# Patient Record
Sex: Male | Born: 1955 | Race: White | Hispanic: No | Marital: Married | Smoking: Current some day smoker
Health system: Southern US, Community
[De-identification: ages and names within clinical notes are randomized; demographics above are authoritative.]

## PROBLEM LIST (undated history)

## (undated) DIAGNOSIS — D649 Anemia, unspecified: Secondary | ICD-10-CM

## (undated) DIAGNOSIS — K219 Gastro-esophageal reflux disease without esophagitis: Secondary | ICD-10-CM

## (undated) DIAGNOSIS — E785 Hyperlipidemia, unspecified: Secondary | ICD-10-CM

## (undated) DIAGNOSIS — M1990 Unspecified osteoarthritis, unspecified site: Secondary | ICD-10-CM

## (undated) DIAGNOSIS — Z8619 Personal history of other infectious and parasitic diseases: Secondary | ICD-10-CM

## (undated) HISTORY — PX: SINOSCOPY: SHX187

## (undated) HISTORY — PX: SPINAL FUSION: SHX223

## (undated) HISTORY — PX: KNEE ARTHROSCOPY: SUR90

## (undated) HISTORY — PX: ANTERIOR CRUCIATE LIGAMENT REPAIR: SHX115

---

## 1999-01-26 ENCOUNTER — Other Ambulatory Visit: Admission: RE | Admit: 1999-01-26 | Discharge: 1999-01-26 | Payer: Self-pay | Admitting: Urology

## 1999-04-08 ENCOUNTER — Other Ambulatory Visit: Admission: RE | Admit: 1999-04-08 | Discharge: 1999-04-08 | Payer: Self-pay | Admitting: Urology

## 1999-12-16 ENCOUNTER — Other Ambulatory Visit: Admission: RE | Admit: 1999-12-16 | Discharge: 1999-12-16 | Payer: Self-pay | Admitting: Urology

## 2000-10-03 ENCOUNTER — Other Ambulatory Visit: Admission: RE | Admit: 2000-10-03 | Discharge: 2000-10-03 | Payer: Self-pay | Admitting: Urology

## 2014-08-21 ENCOUNTER — Other Ambulatory Visit (HOSPITAL_COMMUNITY): Payer: Self-pay | Admitting: Neurosurgery

## 2014-08-29 NOTE — Pre-Procedure Instructions (Signed)
ARGIE APPLEGATE  08/29/2014   Your procedure is scheduled on: Monday, September 08, 2014  Report to [facility] Admitting at 5:30 AM.  Call this number if you have problems the morning of surgery: [phone]   Remember:   Do not eat food or drink liquids after midnight Sunday, September 07, 2014   Take these medicines the morning of surgery with A SIP OF WATER: none  Stop taking Aspirin, vitamins, and herbal medications. Do not take any NSAIDs ie: Ibuprofen, Advil, Naproxen or any medication containing Aspirin such as Meloxicam (MOBIC) ; stop wed/09/03/14   Do not wear jewelry, make-up or nail polish.  Do not wear lotions, powders, or perfumes. You may not wear deodorant.  Do not shave 48 hours prior to surgery. Men may shave face and neck.  Do not bring valuables to the hospital.  [facility] is not responsible for any belongings or valuables.               Contacts, dentures or bridgework may not be worn into surgery.  Leave suitcase in the car. After surgery it may be brought to your room.  For patients admitted to the hospital, discharge time is determined by your treatment team.               Patients discharged the day of surgery will not be allowed to drive home.  Name and phone number of your driver:   Special Instructions:  Special Instructions:Special Instructions: [place] - Preparing for Surgery  Before surgery, you can play an important role.  Because skin is not sterile, your skin needs to be as free of germs as possible.  You can reduce the number of germs on you skin by washing with CHG (chlorahexidine gluconate) soap before surgery.  CHG is an antiseptic cleaner which kills germs and bonds with the skin to continue killing germs even after washing.  Please DO NOT use if you have an allergy to CHG or antibacterial soaps.  If your skin becomes reddened/irritated stop using the CHG and inform your nurse when you arrive at Short Stay.  Do not shave (including  legs and underarms) for at least 48 hours prior to the first CHG shower.  You may shave your face.  Please follow these instructions carefully:   1.  Shower with CHG Soap the night before surgery and the morning of Surgery.  2.  If you choose to wash your hair, wash your hair first as usual with your normal shampoo.  3.  After you shampoo, rinse your hair and body thoroughly to remove the Shampoo.  4.  Use CHG as you would any other liquid soap.  You can apply chg directly  to the skin and wash gently with scrungie or a clean washcloth.  5.  Apply the CHG Soap to your body ONLY FROM THE NECK DOWN.  Do not use on open wounds or open sores.  Avoid contact with your eyes, ears, mouth and genitals (private parts).  Wash genitals (private parts) with your normal soap.  6.  Wash thoroughly, paying special attention to the area where your surgery will be performed.  7.  Thoroughly rinse your body with warm water from the neck down.  8.  DO NOT shower/wash with your normal soap after using and rinsing off the CHG Soap.  9.  Pat yourself dry with a clean towel.            10 .  Wear clean  pajamas.            11.  Place clean sheets on your bed the night of your first shower and do not sleep with pets.  Day of Surgery  Do not apply any lotions/deodorants the morning of surgery.  Please wear clean clothes to the hospital/surgery center.   Please read over the following fact sheets that you were given: Pain Booklet, Coughing and Deep Breathing, MRSA Information and Surgical Site Infection Prevention

## 2014-09-01 ENCOUNTER — Encounter (HOSPITAL_COMMUNITY): Payer: Self-pay

## 2014-09-01 ENCOUNTER — Encounter (HOSPITAL_COMMUNITY)
Admission: RE | Admit: 2014-09-01 | Discharge: 2014-09-01 | Disposition: A | Discharge status: Home / Self Care | Payer: 59 | Source: Ambulatory Visit | Attending: Neurosurgery | Admitting: Neurosurgery

## 2014-09-01 DIAGNOSIS — Z01812 Encounter for preprocedural laboratory examination: Secondary | ICD-10-CM | POA: Diagnosis present

## 2014-09-01 DIAGNOSIS — G9519 Other vascular myelopathies: Secondary | ICD-10-CM | POA: Insufficient documentation

## 2014-09-01 HISTORY — DX: Gastro-esophageal reflux disease without esophagitis: K21.9

## 2014-09-01 HISTORY — DX: Unspecified osteoarthritis, unspecified site: M19.90

## 2014-09-01 LAB — CBC
HCT: 41.3 % (ref 39.0–52.0)
Hemoglobin: 14.4 g/dL (ref 13.0–17.0)
MCH: 31.7 pg (ref 26.0–34.0)
MCHC: 34.9 g/dL (ref 30.0–36.0)
MCV: 91 fL (ref 78.0–100.0)
Platelets: 194 10*3/uL (ref 150–400)
RBC: 4.54 MIL/uL (ref 4.22–5.81)
RDW: 13.3 % (ref 11.5–15.5)
WBC: 6.8 10*3/uL (ref 4.0–10.5)

## 2014-09-01 LAB — BASIC METABOLIC PANEL
Anion gap: 3 — ABNORMAL LOW (ref 5–15)
BUN: 17 mg/dL (ref 6–23)
CO2: 29 mmol/L (ref 19–32)
Calcium: 9.2 mg/dL (ref 8.4–10.5)
Chloride: 105 mEq/L (ref 96–112)
Creatinine, Ser: 1.15 mg/dL (ref 0.50–1.35)
GFR calc Af Amer: 79 mL/min — ABNORMAL LOW (ref >90)
GFR calc non Af Amer: 68 mL/min — ABNORMAL LOW (ref >90)
Glucose, Bld: 105 mg/dL — ABNORMAL HIGH (ref 70–99)
Potassium: 4 mmol/L (ref 3.5–5.1)
Sodium: 137 mmol/L (ref 135–145)

## 2014-09-01 LAB — SURGICAL PCR SCREEN
MRSA, PCR: NEGATIVE
Staphylococcus aureus: NEGATIVE

## 2014-09-07 MED ORDER — CEFAZOLIN SODIUM-DEXTROSE 2-3 GM-% IV SOLR
2.0000 g | INTRAVENOUS | Status: AC
  Administered 2014-09-08: 2 g via INTRAVENOUS
  Filled 2014-09-07: qty 50

## 2014-09-08 ENCOUNTER — Ambulatory Visit (HOSPITAL_COMMUNITY): Payer: 59 | Admitting: Anesthesiology

## 2014-09-08 ENCOUNTER — Ambulatory Visit (HOSPITAL_COMMUNITY): Payer: 59

## 2014-09-08 ENCOUNTER — Ambulatory Visit (HOSPITAL_COMMUNITY)
Admission: RE | Admit: 2014-09-08 | Discharge: 2014-09-09 | Disposition: A | Discharge status: Home / Self Care | Payer: 59 | Source: Ambulatory Visit | Attending: Neurosurgery | Admitting: Neurosurgery

## 2014-09-08 ENCOUNTER — Encounter (HOSPITAL_COMMUNITY)
Admission: RE | Disposition: A | Discharge status: Home / Self Care | Payer: Self-pay | Source: Ambulatory Visit | Attending: Neurosurgery

## 2014-09-08 ENCOUNTER — Encounter (HOSPITAL_COMMUNITY): Payer: Self-pay | Admitting: *Deleted

## 2014-09-08 DIAGNOSIS — M47896 Other spondylosis, lumbar region: Secondary | ICD-10-CM | POA: Insufficient documentation

## 2014-09-08 DIAGNOSIS — F17210 Nicotine dependence, cigarettes, uncomplicated: Secondary | ICD-10-CM | POA: Diagnosis not present

## 2014-09-08 DIAGNOSIS — M4806 Spinal stenosis, lumbar region: Secondary | ICD-10-CM | POA: Diagnosis present

## 2014-09-08 DIAGNOSIS — M48061 Spinal stenosis, lumbar region without neurogenic claudication: Secondary | ICD-10-CM

## 2014-09-08 DIAGNOSIS — M1990 Unspecified osteoarthritis, unspecified site: Secondary | ICD-10-CM | POA: Insufficient documentation

## 2014-09-08 DIAGNOSIS — M5136 Other intervertebral disc degeneration, lumbar region: Secondary | ICD-10-CM | POA: Insufficient documentation

## 2014-09-08 DIAGNOSIS — K219 Gastro-esophageal reflux disease without esophagitis: Secondary | ICD-10-CM | POA: Insufficient documentation

## 2014-09-08 DIAGNOSIS — M48062 Spinal stenosis, lumbar region with neurogenic claudication: Secondary | ICD-10-CM | POA: Diagnosis present

## 2014-09-08 HISTORY — PX: LUMBAR LAMINECTOMY/DECOMPRESSION MICRODISCECTOMY: SHX5026

## 2014-09-08 SURGERY — LUMBAR LAMINECTOMY/DECOMPRESSION MICRODISCECTOMY 1 LEVEL
Anesthesia: General | Laterality: Bilateral

## 2014-09-08 MED ORDER — GLYCOPYRROLATE 0.2 MG/ML IJ SOLN
INTRAMUSCULAR | Status: AC
  Filled 2014-09-08: qty 2

## 2014-09-08 MED ORDER — BUPIVACAINE HCL (PF) 0.5 % IJ SOLN
INTRAMUSCULAR | Status: DC | PRN
  Administered 2014-09-08: 10 mL

## 2014-09-08 MED ORDER — KETOROLAC TROMETHAMINE 0.5 % OP SOLN
1.0000 [drp] | Freq: Four times a day (QID) | OPHTHALMIC | Status: DC
  Administered 2014-09-08 (×2): 1 [drp] via OPHTHALMIC
  Filled 2014-09-08: qty 3

## 2014-09-08 MED ORDER — HYDROMORPHONE HCL 1 MG/ML IJ SOLN
INTRAMUSCULAR | Status: AC
  Administered 2014-09-08: 0.5 mg via INTRAVENOUS
  Filled 2014-09-08: qty 1

## 2014-09-08 MED ORDER — FENTANYL CITRATE 0.05 MG/ML IJ SOLN
INTRAMUSCULAR | Status: AC
  Filled 2014-09-08: qty 5

## 2014-09-08 MED ORDER — ACETAMINOPHEN 325 MG PO TABS
650.0000 mg | ORAL_TABLET | ORAL | Status: DC | PRN
  Administered 2014-09-08: 650 mg via ORAL
  Filled 2014-09-08: qty 2

## 2014-09-08 MED ORDER — KETOROLAC TROMETHAMINE 30 MG/ML IJ SOLN
30.0000 mg | Freq: Four times a day (QID) | INTRAMUSCULAR | Status: DC
  Administered 2014-09-08 – 2014-09-09 (×4): 30 mg via INTRAVENOUS
  Filled 2014-09-08 (×12): qty 1

## 2014-09-08 MED ORDER — KCL IN DEXTROSE-NACL 20-5-0.45 MEQ/L-%-% IV SOLN
INTRAVENOUS | Status: DC
  Filled 2014-09-08 (×4): qty 1000

## 2014-09-08 MED ORDER — KETOROLAC TROMETHAMINE 30 MG/ML IJ SOLN
30.0000 mg | Freq: Once | INTRAMUSCULAR | Status: AC
  Administered 2014-09-08: 30 mg via INTRAVENOUS

## 2014-09-08 MED ORDER — EPHEDRINE SULFATE 50 MG/ML IJ SOLN
INTRAMUSCULAR | Status: DC | PRN
  Administered 2014-09-08 (×2): 2.5 mg via INTRAVENOUS
  Administered 2014-09-08: 5 mg via INTRAVENOUS

## 2014-09-08 MED ORDER — MENTHOL 3 MG MT LOZG
1.0000 | LOZENGE | OROMUCOSAL | Status: DC | PRN
  Filled 2014-09-08: qty 9

## 2014-09-08 MED ORDER — SODIUM CHLORIDE 0.9 % IJ SOLN: 3.0000 mL | INTRAMUSCULAR | Status: DC | PRN

## 2014-09-08 MED ORDER — SODIUM CHLORIDE 0.9 % IJ SOLN
INTRAMUSCULAR | Status: AC
  Filled 2014-09-08: qty 10

## 2014-09-08 MED ORDER — CYCLOBENZAPRINE HCL 10 MG PO TABS
10.0000 mg | ORAL_TABLET | Freq: Three times a day (TID) | ORAL | Status: DC | PRN
  Administered 2014-09-08 – 2014-09-09 (×4): 10 mg via ORAL
  Filled 2014-09-08 (×4): qty 1

## 2014-09-08 MED ORDER — HEMOSTATIC AGENTS (NO CHARGE) OPTIME
TOPICAL | Status: DC | PRN
  Administered 2014-09-08: 1 via TOPICAL

## 2014-09-08 MED ORDER — MIDAZOLAM HCL 5 MG/5ML IJ SOLN
INTRAMUSCULAR | Status: DC | PRN
  Administered 2014-09-08: 1 mg via INTRAVENOUS

## 2014-09-08 MED ORDER — OXYCODONE HCL 5 MG/5ML PO SOLN: 5.0000 mg | Freq: Once | ORAL | Status: AC | PRN

## 2014-09-08 MED ORDER — ROCURONIUM BROMIDE 50 MG/5ML IV SOLN
INTRAVENOUS | Status: AC
  Filled 2014-09-08: qty 1

## 2014-09-08 MED ORDER — SODIUM CHLORIDE 0.9 % IJ SOLN: 3.0000 mL | Freq: Two times a day (BID) | INTRAMUSCULAR | Status: DC

## 2014-09-08 MED ORDER — CYCLOBENZAPRINE HCL 10 MG PO TABS
ORAL_TABLET | ORAL | Status: AC
  Filled 2014-09-08: qty 1

## 2014-09-08 MED ORDER — ONDANSETRON HCL 4 MG/2ML IJ SOLN
INTRAMUSCULAR | Status: DC | PRN
  Administered 2014-09-08: 4 mg via INTRAVENOUS

## 2014-09-08 MED ORDER — ONDANSETRON HCL 4 MG/2ML IJ SOLN: 4.0000 mg | Freq: Four times a day (QID) | INTRAMUSCULAR | Status: DC | PRN

## 2014-09-08 MED ORDER — THROMBIN 5000 UNITS EX SOLR
CUTANEOUS | Status: DC | PRN
  Administered 2014-09-08 (×2): 5000 [IU] via TOPICAL

## 2014-09-08 MED ORDER — NEOSTIGMINE METHYLSULFATE 10 MG/10ML IV SOLN
INTRAVENOUS | Status: DC | PRN
  Administered 2014-09-08: 3 mg via INTRAVENOUS

## 2014-09-08 MED ORDER — HYDROCODONE-ACETAMINOPHEN 5-325 MG PO TABS
1.0000 | ORAL_TABLET | ORAL | Status: DC | PRN
  Administered 2014-09-08 (×3): 1 via ORAL
  Filled 2014-09-08 (×3): qty 1

## 2014-09-08 MED ORDER — LIDOCAINE HCL (CARDIAC) 20 MG/ML IV SOLN
INTRAVENOUS | Status: AC
  Filled 2014-09-08: qty 5

## 2014-09-08 MED ORDER — ONDANSETRON HCL 4 MG/2ML IJ SOLN
INTRAMUSCULAR | Status: AC
  Filled 2014-09-08: qty 2

## 2014-09-08 MED ORDER — FENTANYL CITRATE 0.05 MG/ML IJ SOLN
INTRAMUSCULAR | Status: DC | PRN
Start: 2014-09-08 — End: 2014-09-08
  Administered 2014-09-08: 50 ug via INTRAVENOUS
  Administered 2014-09-08 (×4): 25 ug via INTRAVENOUS
  Administered 2014-09-08: 100 ug via INTRAVENOUS

## 2014-09-08 MED ORDER — 0.9 % SODIUM CHLORIDE (POUR BTL) OPTIME
TOPICAL | Status: DC | PRN
  Administered 2014-09-08: 1000 mL

## 2014-09-08 MED ORDER — SUCCINYLCHOLINE CHLORIDE 20 MG/ML IJ SOLN
INTRAMUSCULAR | Status: AC
  Filled 2014-09-08: qty 1

## 2014-09-08 MED ORDER — BISACODYL 10 MG RE SUPP: 10.0000 mg | Freq: Every day | RECTAL | Status: DC | PRN

## 2014-09-08 MED ORDER — HYDROMORPHONE HCL 1 MG/ML IJ SOLN
0.2500 mg | INTRAMUSCULAR | Status: DC | PRN
  Administered 2014-09-08 (×3): 0.5 mg via INTRAVENOUS

## 2014-09-08 MED ORDER — SODIUM CHLORIDE 0.9 % IR SOLN
Status: DC | PRN
  Administered 2014-09-08: 08:00:00

## 2014-09-08 MED ORDER — MIDAZOLAM HCL 2 MG/2ML IJ SOLN
INTRAMUSCULAR | Status: AC
  Filled 2014-09-08: qty 2

## 2014-09-08 MED ORDER — ACETAMINOPHEN 10 MG/ML IV SOLN
INTRAVENOUS | Status: AC
  Administered 2014-09-08: 1000 mg via INTRAVENOUS
  Filled 2014-09-08: qty 100

## 2014-09-08 MED ORDER — GLYCOPYRROLATE 0.2 MG/ML IJ SOLN
INTRAMUSCULAR | Status: DC | PRN
  Administered 2014-09-08: 0.4 mg via INTRAVENOUS

## 2014-09-08 MED ORDER — MORPHINE SULFATE 4 MG/ML IJ SOLN: 4.0000 mg | INTRAMUSCULAR | Status: DC | PRN

## 2014-09-08 MED ORDER — LIDOCAINE-EPINEPHRINE 1 %-1:100000 IJ SOLN
INTRAMUSCULAR | Status: DC | PRN
  Administered 2014-09-08: 10 mL

## 2014-09-08 MED ORDER — LIDOCAINE HCL 4 % MT SOLN
OROMUCOSAL | Status: DC | PRN
  Administered 2014-09-08: 4 mL via TOPICAL

## 2014-09-08 MED ORDER — ACETAMINOPHEN 650 MG RE SUPP: 650.0000 mg | RECTAL | Status: DC | PRN

## 2014-09-08 MED ORDER — ROCURONIUM BROMIDE 100 MG/10ML IV SOLN
INTRAVENOUS | Status: DC | PRN
  Administered 2014-09-08: 5 mg via INTRAVENOUS
  Administered 2014-09-08: 50 mg via INTRAVENOUS
  Administered 2014-09-08: 10 mg via INTRAVENOUS
  Administered 2014-09-08: 5 mg via INTRAVENOUS
  Administered 2014-09-08: 10 mg via INTRAVENOUS

## 2014-09-08 MED ORDER — OXYCODONE HCL 5 MG PO TABS
5.0000 mg | ORAL_TABLET | Freq: Once | ORAL | Status: AC | PRN
  Administered 2014-09-08: 5 mg via ORAL

## 2014-09-08 MED ORDER — LIDOCAINE HCL (CARDIAC) 20 MG/ML IV SOLN
INTRAVENOUS | Status: DC | PRN
  Administered 2014-09-08: 80 mg via INTRAVENOUS

## 2014-09-08 MED ORDER — ARTIFICIAL TEARS OP OINT
TOPICAL_OINTMENT | OPHTHALMIC | Status: AC
  Filled 2014-09-08: qty 3.5

## 2014-09-08 MED ORDER — GELATIN ABSORBABLE MT POWD
OROMUCOSAL | Status: DC | PRN
  Administered 2014-09-08: 10:00:00 via TOPICAL

## 2014-09-08 MED ORDER — ZOLPIDEM TARTRATE 5 MG PO TABS: 5.0000 mg | ORAL_TABLET | Freq: Every evening | ORAL | Status: DC | PRN

## 2014-09-08 MED ORDER — NEOSTIGMINE METHYLSULFATE 10 MG/10ML IV SOLN
INTRAVENOUS | Status: AC
  Filled 2014-09-08: qty 1

## 2014-09-08 MED ORDER — PROPOFOL 10 MG/ML IV BOLUS
INTRAVENOUS | Status: AC
  Filled 2014-09-08: qty 20

## 2014-09-08 MED ORDER — PHENYLEPHRINE HCL 10 MG/ML IJ SOLN
INTRAMUSCULAR | Status: DC | PRN
  Administered 2014-09-08 (×2): 80 ug via INTRAVENOUS
  Administered 2014-09-08: 240 ug via INTRAVENOUS

## 2014-09-08 MED ORDER — PROPOFOL 10 MG/ML IV BOLUS
INTRAVENOUS | Status: DC | PRN
  Administered 2014-09-08: 160 mg via INTRAVENOUS

## 2014-09-08 MED ORDER — OXYCODONE-ACETAMINOPHEN 5-325 MG PO TABS
1.0000 | ORAL_TABLET | ORAL | Status: DC | PRN
  Administered 2014-09-09 (×3): 2 via ORAL
  Filled 2014-09-08 (×3): qty 2

## 2014-09-08 MED ORDER — MAGNESIUM HYDROXIDE 400 MG/5ML PO SUSP: 30.0000 mL | Freq: Every day | ORAL | Status: DC | PRN

## 2014-09-08 MED ORDER — PHENOL 1.4 % MT LIQD: 1.0000 | OROMUCOSAL | Status: DC | PRN

## 2014-09-08 MED ORDER — KETOROLAC TROMETHAMINE 30 MG/ML IJ SOLN
INTRAMUSCULAR | Status: AC
  Filled 2014-09-08: qty 1

## 2014-09-08 MED ORDER — HYDROXYZINE HCL 25 MG PO TABS: 50.0000 mg | ORAL_TABLET | ORAL | Status: DC | PRN

## 2014-09-08 MED ORDER — OXYCODONE HCL 5 MG PO TABS
ORAL_TABLET | ORAL | Status: AC
  Administered 2014-09-08: 5 mg via ORAL
  Filled 2014-09-08: qty 1

## 2014-09-08 MED ORDER — ALUM & MAG HYDROXIDE-SIMETH 200-200-20 MG/5ML PO SUSP: 30.0000 mL | Freq: Four times a day (QID) | ORAL | Status: DC | PRN

## 2014-09-08 MED ORDER — LACTATED RINGERS IV SOLN
INTRAVENOUS | Status: DC | PRN
  Administered 2014-09-08 (×2): via INTRAVENOUS

## 2014-09-08 MED ORDER — SODIUM CHLORIDE 0.9 % IV SOLN: 250.0000 mL | INTRAVENOUS | Status: DC

## 2014-09-08 SURGICAL SUPPLY — 65 items
ADH SKN CLS APL DERMABOND .7 (GAUZE/BANDAGES/DRESSINGS) ×1
APL SKNCLS STERI-STRIP NONHPOA (GAUZE/BANDAGES/DRESSINGS)
BAG DECANTER FOR FLEXI CONT (MISCELLANEOUS) ×2 IMPLANT
BENZOIN TINCTURE PRP APPL 2/3 (GAUZE/BANDAGES/DRESSINGS) IMPLANT
BLADE CLIPPER SURG (BLADE) IMPLANT
BRUSH SCRUB EZ PLAIN DRY (MISCELLANEOUS) ×2 IMPLANT
BUR ACORN 6.0 ACORN (BURR) ×1 IMPLANT
BUR ACRON 5.0MM COATED (BURR) IMPLANT
BUR MATCHSTICK NEURO 3.0 LAGG (BURR) ×2 IMPLANT
CANISTER SUCT 3000ML (MISCELLANEOUS) ×2 IMPLANT
CONT SPEC 4OZ CLIKSEAL STRL BL (MISCELLANEOUS) IMPLANT
DERMABOND ADVANCED (GAUZE/BANDAGES/DRESSINGS) ×1
DERMABOND ADVANCED .7 DNX12 (GAUZE/BANDAGES/DRESSINGS) IMPLANT
DRAPE LAPAROTOMY 100X72X124 (DRAPES) ×2 IMPLANT
DRAPE MICROSCOPE LEICA (MISCELLANEOUS) ×2 IMPLANT
DRAPE POUCH INSTRU U-SHP 10X18 (DRAPES) ×2 IMPLANT
DRSG EMULSION OIL 3X3 NADH (GAUZE/BANDAGES/DRESSINGS) IMPLANT
ELECT REM PT RETURN 9FT ADLT (ELECTROSURGICAL) ×2
ELECTRODE REM PT RTRN 9FT ADLT (ELECTROSURGICAL) ×1 IMPLANT
GAUZE SPONGE 4X4 12PLY STRL (GAUZE/BANDAGES/DRESSINGS) ×1 IMPLANT
GAUZE SPONGE 4X4 16PLY XRAY LF (GAUZE/BANDAGES/DRESSINGS) IMPLANT
GLOVE BIOGEL PI IND STRL 8 (GLOVE) ×1 IMPLANT
GLOVE BIOGEL PI INDICATOR 8 (GLOVE) ×1
GLOVE ECLIPSE 7.5 STRL STRAW (GLOVE) ×2 IMPLANT
GLOVE EXAM NITRILE LRG STRL (GLOVE) IMPLANT
GLOVE EXAM NITRILE MD LF STRL (GLOVE) IMPLANT
GLOVE EXAM NITRILE XL STR (GLOVE) IMPLANT
GLOVE EXAM NITRILE XS STR PU (GLOVE) IMPLANT
GOWN STRL REUS W/ TWL LRG LVL3 (GOWN DISPOSABLE) ×1 IMPLANT
GOWN STRL REUS W/ TWL XL LVL3 (GOWN DISPOSABLE) IMPLANT
GOWN STRL REUS W/TWL 2XL LVL3 (GOWN DISPOSABLE) IMPLANT
GOWN STRL REUS W/TWL LRG LVL3 (GOWN DISPOSABLE) ×2
GOWN STRL REUS W/TWL XL LVL3 (GOWN DISPOSABLE)
GRAFT DURAGEN MATRIX 2WX2L ×1 IMPLANT
KIT BASIN OR (CUSTOM PROCEDURE TRAY) ×2 IMPLANT
KIT ROOM TURNOVER OR (KITS) ×2 IMPLANT
LIQUID BAND (GAUZE/BANDAGES/DRESSINGS) IMPLANT
NDL HYPO 18GX1.5 BLUNT FILL (NEEDLE) IMPLANT
NDL SPNL 18GX3.5 QUINCKE PK (NEEDLE) ×1 IMPLANT
NDL SPNL 22GX3.5 QUINCKE BK (NEEDLE) ×1 IMPLANT
NEEDLE HYPO 18GX1.5 BLUNT FILL (NEEDLE) IMPLANT
NEEDLE SPNL 18GX3.5 QUINCKE PK (NEEDLE) ×2 IMPLANT
NEEDLE SPNL 22GX3.5 QUINCKE BK (NEEDLE) ×2 IMPLANT
NS IRRIG 1000ML POUR BTL (IV SOLUTION) ×2 IMPLANT
PACK LAMINECTOMY NEURO (CUSTOM PROCEDURE TRAY) ×2 IMPLANT
PAD ARMBOARD 7.5X6 YLW CONV (MISCELLANEOUS) ×6 IMPLANT
PATTIES SURGICAL .5 X.5 (GAUZE/BANDAGES/DRESSINGS) ×1 IMPLANT
PATTIES SURGICAL .5 X1 (DISPOSABLE) ×2 IMPLANT
PATTIES SURGICAL 1X1 (DISPOSABLE) ×1 IMPLANT
RUBBERBAND STERILE (MISCELLANEOUS) ×4 IMPLANT
SPONGE LAP 4X18 X RAY DECT (DISPOSABLE) IMPLANT
SPONGE SURGIFOAM ABS GEL SZ50 (HEMOSTASIS) ×2 IMPLANT
STRIP CLOSURE SKIN 1/2X4 (GAUZE/BANDAGES/DRESSINGS) IMPLANT
SURGIFLO TRUKIT (HEMOSTASIS) ×1 IMPLANT
SUT PROLENE 6 0 BV (SUTURE) ×2 IMPLANT
SUT VIC AB 1 CT1 18XBRD ANBCTR (SUTURE) ×1 IMPLANT
SUT VIC AB 1 CT1 8-18 (SUTURE) ×2
SUT VIC AB 2-0 CP2 18 (SUTURE) ×2 IMPLANT
SUT VIC AB 3-0 SH 8-18 (SUTURE) ×1 IMPLANT
SYR 20ML ECCENTRIC (SYRINGE) ×2 IMPLANT
SYR 5ML LL (SYRINGE) IMPLANT
TAPE CLOTH SURG 4X10 WHT LF (GAUZE/BANDAGES/DRESSINGS) ×1 IMPLANT
TOWEL OR 17X24 6PK STRL BLUE (TOWEL DISPOSABLE) ×2 IMPLANT
TOWEL OR 17X26 10 PK STRL BLUE (TOWEL DISPOSABLE) ×2 IMPLANT
WATER STERILE IRR 1000ML POUR (IV SOLUTION) ×2 IMPLANT

## 2014-09-08 NOTE — Transfer of Care (Signed)
Immediate Anesthesia Transfer of Care Note  Patient: Manuel Harper  Procedure(s) Performed: Procedure(s) with comments: LUMBAR LAMINECTOMY/DECOMPRESSION MICRODISCECTOMY 1 LEVEL (Bilateral) - bilateral L45 lumbar laminotomy and foraminotomy  Patient Location: PACU  Anesthesia Type:General  Level of Consciousness: awake, alert  and oriented  Airway & Oxygen Therapy: Patient Spontanous Breathing and Patient connected to nasal cannula oxygen  Post-op Assessment: Report given to PACU RN, Post -op Vital signs reviewed and stable and Patient moving all extremities  Post vital signs: Reviewed and stable  Complications: No apparent anesthesia complications

## 2014-09-08 NOTE — Anesthesia Procedure Notes (Addendum)
Procedure Name: Intubation Date/Time: 09/08/2014 7:31 AM Performed by: Izora Gala Pre-anesthesia Checklist: Patient identified and Emergency Drugs available Patient Re-evaluated:Patient Re-evaluated prior to inductionOxygen Delivery Method: Circle system utilized Preoxygenation: Pre-oxygenation with 100% oxygen Intubation Type: IV induction Ventilation: Mask ventilation without difficulty Laryngoscope Size: Miller and 3 Grade View: Grade I Tube type: Oral Tube size: 8.0 mm Number of attempts: 1 Airway Equipment and Method: Stylet and LTA kit utilized Placement Confirmation: ETT inserted through vocal cords under direct vision,  positive ETCO2 and breath sounds checked- equal and bilateral Secured at: 22 cm Tube secured with: Tape Dental Injury: Teeth and Oropharynx as per pre-operative assessment     Tube taped on left. Side of mouth per Dr. Marcie Bal because of cold sore on right lip

## 2014-09-08 NOTE — Anesthesia Preprocedure Evaluation (Addendum)
Anesthesia Evaluation  Patient identified by MRN, date of birth, ID band Patient awake    Reviewed: Allergy & Precautions, NPO status , Patient's Chart, lab work & pertinent test results  History of Anesthesia Complications Negative for: history of anesthetic complications  Airway Mallampati: II  TM Distance: >3 FB Neck ROM: full    Dental  (+) Teeth Intact   Pulmonary Current Smoker, former smoker,  breath sounds clear to auscultation  Pulmonary exam normal       Cardiovascular negative cardio ROS  Rhythm:Regular     Neuro/Psych Sciatica bilateral legs negative psych ROS   GI/Hepatic Neg liver ROS, GERD-  ,Diet controlled   Endo/Other  negative endocrine ROS  Renal/GU negative Renal ROS     Musculoskeletal  (+) Arthritis -,   Abdominal   Peds  Hematology negative hematology ROS (+)   Anesthesia Other Findings   Reproductive/Obstetrics                           Anesthesia Physical Anesthesia Plan  ASA: II  Anesthesia Plan: General   Post-op Pain Management:    Induction: Intravenous  PONV Risk Score and Plan:   Airway Management Planned: Oral ETT  Additional Equipment:   Intra-op Plan:   Post-operative Plan: Extubation in OR  Informed Consent: I have reviewed the patients History and Physical, chart, labs and discussed the procedure including the risks, benefits and alternatives for the proposed anesthesia with the patient or authorized representative who has indicated his/her understanding and acceptance.   Dental advisory given  Plan Discussed with: CRNA, Anesthesiologist and Surgeon  Anesthesia Plan Comments:         Anesthesia Quick Evaluation

## 2014-09-08 NOTE — H&P (Signed)
Subjective: Patient is a 59 y.o. 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  Patient has had progressively worsening symptoms over the past year with pain radiating into the buttocks, posterior thighs, and calves  bilaterally. He's been treated with epidural surgery injections and physical therapy without lasting relief. He is admitted now for a bilateral L4-5 lumbar laminotomy and foraminotomies.    Past Medical History  Diagnosis Date  . GERD (gastroesophageal reflux disease)   . Arthritis     Past Surgical History  Procedure Laterality Date  . Anterior cruciate ligament repair Bilateral   . Knee arthroscopy Bilateral     over yrs    Prescriptions prior to admission  Medication Sig Dispense Refill Last Dose  . acetaminophen (TYLENOL) 500 MG tablet Take 1,000 mg by mouth 2 (two) times daily.   1 tab 09/07/2014 at Unknown time  . diphenhydramine-acetaminophen (TYLENOL PM) 25-500 MG TABS Take 1 tablet by mouth at bedtime.   09/07/2014 at Unknown time  . lovastatin (MEVACOR) 40 MG tablet Take 40 mg by mouth at bedtime.   3 09/06/2014  . meloxicam (MOBIC) 15 MG tablet Take 15 mg by mouth daily.   11 09/02/2014  . Menthol, Topical Analgesic, (BIOFREEZE EX) Apply 1 application topically daily as needed (muscle aches).   ~ 1 week  . Menthol, Topical Analgesic, 6 % GEL Apply topically.   09/07/2014 at Unknown time  . valACYclovir (VALTREX) 500 MG tablet Take 500 mg by mouth daily as needed.   1 09/05/2014   No Known Allergies  History  Substance Use Topics  . Smoking status: Current Some Day Smoker    Types: Cigars  . Smokeless tobacco: Not on file  . Alcohol Use: 6.0 oz/week    5 Glasses of wine, 5 Cans of beer per week     Comment: 5-6 per week    History reviewed. No pertinent family history.   Review of Systems Review of systems is notable for those difficulties described in his history of present  illness and past medical history, but is otherwise unremarkable.  Objective: Vital signs in last 24 hours: Temp:  [98 F (36.7 C)] 98 F (36.7 C) (01/25 0629) Pulse Rate:  [79] 79 (01/25 0629) Resp:  [20] 20 (01/25 0629) BP: (124)/(85) 124/85 mmHg (01/25 0629) SpO2:  [99 %] 99 % (01/25 0629) Weight:  [82.101 kg (181 lb)] 82.101 kg (181 lb) (01/25 0629)  EXAM: patient well-developed well-nourished white male in no acute distress. Lungs are clear to auscultation , the patient has symmetrical respiratory excursion. Heart has a regular rate and rhythm normal S1 and S2 no murmur.   Abdomen is soft nontender nondistended bowel sounds are present. Extremity examination shows no clubbing cyanosis or edema. Motor examination shows 5 over 5 strength in the lower extremities including the iliopsoas quadriceps dorsiflexor extensor hallicus  longus and plantar flexor bilaterally. Sensation is intact to pinprick in the distal lower extremities. Reflexes are symmetrical bilaterally. No pathologic reflexes are present. Patient has a normal gait and stance.   Data Review:CBC    Component Value Date/Time   WBC 6.8 09/01/2014 0834   RBC 4.54 09/01/2014 0834   HGB 14.4 09/01/2014 0834   HCT 41.3 09/01/2014 0834   PLT 194 09/01/2014 0834   MCV 91.0 09/01/2014 0834   MCH 31.7 09/01/2014 0834   MCHC 34.9 09/01/2014 0834   RDW 13.3 09/01/2014 0834  BMET    Component Value Date/Time   NA 137 09/01/2014 0834   K 4.0 09/01/2014 0834   CL 105 09/01/2014 0834   CO2 29 09/01/2014 0834   GLUCOSE 105* 09/01/2014 0834   BUN 17 09/01/2014 0834   CREATININE 1.15 09/01/2014 0834   CALCIUM 9.2 09/01/2014 0834   GFRNONAA 68* 09/01/2014 0834   GFRAA 79* 09/01/2014 0834     Assessment/Plan: Patient with multifactorial L4-5 lumbar stenosis who is admitted for bilateral L4-5 lumbar laminotomy and foraminotomy.  I've discussed with the patient the nature of his condition, the nature the  surgical procedure, the typical length of surgery, hospital stay, and overall recuperation. We discussed limitations postoperatively. I discussed risks of surgery including risks of infection, bleeding, possibly need for transfusion, the risk of nerve root dysfunction with pain, weakness, numbness, or paresthesias, or risk of dural tear and CSF leakage and possible need for further surgery, the risk of recurrent disc herniation and the possible need for further surgery, and the risk of anesthetic complications including myocardial infarction, stroke, pneumonia, and death. Understanding all this the patient does wish to proceed with surgery and is admitted for such.    Hosie Spangle, MD 09/08/2014 7:08 AM

## 2014-09-08 NOTE — Progress Notes (Signed)
Filed Vitals:   09/08/14 1150 09/08/14 1209 09/08/14 1215 09/08/14 1607  BP: 109/75 139/84  125/80  Pulse: 69 64  98  Temp:  98 F (36.7 C) 98 F (36.7 C)   TempSrc:      Resp: 16 16  16   Weight:      SpO2: 99% 100%  100%    Patient resting in bed, has been flat in bed, with head of bed at 0 since surgery. No headache. Voiding well. Moving all 4 extremity is well. Dressing clean and dry.  Plan: We'll begin to mobilize now, gradually raising head of bed, then sitting and dangling, then standing, and ambulating.  Hosie Spangle, MD 09/08/2014, 5:32 PM

## 2014-09-08 NOTE — Op Note (Signed)
09/08/2014  10:49 AM  PATIENT:  Manuel Harper  59 y.o. male  PRE-OPERATIVE DIAGNOSIS:  lumbar stenosis with neurogenic claudication, lumbar spondylosis, lumbar degenerative disc disease  POST-OPERATIVE DIAGNOSIS:  Lumbar stenosis with neurogenic claudication, lumbar spondylosis, lumbar degenerative disc disease  PROCEDURE:  Procedure(s):  Bilateral L4-5 laminectomy and medial facetectomy with bilateral foraminotomies for the exiting L4 and L5 nerve roots bilaterally, with microdissection, microsurgical technique, and the operating microscope  SURGEON:  Surgeon(s): Hosie Spangle, MD Elaina Hoops, MD  ASSISTANTS: Kary Kos, M.D.  ANESTHESIA:   general  EBL:  Total I/O In: 1000 [I.V.:1000] Out: 150 [Blood:150]  BLOOD ADMINISTERED:none  COUNT: Correct per nursing staff  DICTATION: Patient was brought to the operating room placed under general endotracheal anesthesia. Patient was turned to a prone position the lumbar region was prepped with Betadine soap and solution and draped in a sterile fashion. The midline was infiltrated with local anesthetic with epinephrine. A localizing x-ray was taken and then a midline incision was made carried down thru the subcutaneous tissue, bipolar cautery and electrocautery were used to maintain hemostasis. Dissection was carried down to the lumbar fascia which was incised bilaterally and the paraspinal muscles were dissected from the spinous process and lamina in a subperiosteal fashion. Another localizing x-ray was taken and the L4-5 level was identified. The operating microscope was brought in the field provided additional magnification, illumination, and visualization, and the remainder the decompression was performed using microdissection microsurgical technique. Laminectomy was begun with the high-speed drill and Kerrison punches. The thickened ligamentum flavum was carefully removed. Dissection was carried laterally to decompress the lateral  stenosis, including medial facetectomy. As this was done on the right side a edges of bone being removed lacerated the lateral surface of the exiting right L5 nerve root sleeve. This was closed with 2 interrupted 6-0 Prolene sutures. A good watertight closure was achieved (tested against a Valsalva maneuver to 40 mmHg), and a pledget of DuraGen was placed over the dural repair. Care was taken to leave the facet complexes intact. Once the decompression was completed hemostasis was established with the use of bipolar cautery, Gelfoam with thrombin, and Surgifoam. Deep fascia was closed with interrupted 1 undyed Vicryl sutures. The subcutaneous and subcuticular were closed with interrupted inverted 2-0 undyed Vicryl sutures. Skin edges were approximated with Dermabond. The wound was dressed with sterile gauze and Hypafix.  PLAN OF CARE: Admit for overnight observation  PATIENT DISPOSITION:  PACU - hemodynamically stable.   Delay start of Pharmacological VTE agent (>24hrs) due to surgical blood loss or risk of bleeding:  yes

## 2014-09-09 ENCOUNTER — Encounter (HOSPITAL_COMMUNITY): Payer: Self-pay | Admitting: Neurosurgery

## 2014-09-09 DIAGNOSIS — M4806 Spinal stenosis, lumbar region: Secondary | ICD-10-CM | POA: Diagnosis not present

## 2014-09-09 MED ORDER — OXYCODONE-ACETAMINOPHEN 5-325 MG PO TABS: 1.0000 | ORAL_TABLET | ORAL | Status: DC | PRN

## 2014-09-09 MED ORDER — CYCLOBENZAPRINE HCL 10 MG PO TABS: 5.0000 mg | ORAL_TABLET | Freq: Three times a day (TID) | ORAL | Status: DC | PRN

## 2014-09-09 NOTE — Progress Notes (Signed)
Pt and wife given D/C instructions with Rx's, verbal understanding was provided. Pt's incision is open to air and has no sign of infection. Pt's IV was removed prior to D/C. Pt D/C'd home via wheelchair @ 1130 per MD order. Pt is stable @ D/C and has no other needs at this time. Holli Humbles, RN

## 2014-09-09 NOTE — Discharge Instructions (Signed)
Wound Care Leave incision open to air. You may shower. Do not scrub directly on incision.  Do not put any creams, lotions, or ointments on incision. Activity Walk each and every day, increasing distance each day. No lifting greater than 5 lbs.  Avoid bending, arching, and twisting. No driving for 2 weeks; may ride as a passenger locally. If provided with back brace, wear when out of bed.  It is not necessary to wear in bed. Diet Resume your normal diet.  Return to Work Will be discussed at you follow up appointment. Call Your Doctor If Any of These Occur Redness, drainage, or swelling at the wound.  Temperature greater than 101 degrees. Severe pain not relieved by pain medication. Incision starts to come apart. Follow Up Appt Call today for appointment in 3 weeks ([phone]) or for problems.  If you have any hardware placed in your spine, you will need an x-ray before your appointment.   Laminectomy - Laminotomy - Discectomy Your surgeon has decided that a laminectomy (entire lamina removal) or laminotomy (partial lamina removal) is the best treatment for your back problem. These procedures involve removal of bone to relieve pressure on nerve roots. It allows the surgeon access to parts of the spine where other problems are located. This could be an injured disc (the cartilage-like structures located between the bones of the back). In this surgery your surgeon removes a part of the boney arch that surrounds your spinal canal. This may be compressing nerve roots. In some cases, the surgeon will remove the disc and fuse (stick together) vertebral bodies (the bones of your back) to make the spine more stable. The type of procedure you will need is usually decided prior to surgery, however modifications may be necessary. The time in surgery depends on the findings in surgery and the procedure necessary to correct the problems. DISCECTOMY For people with disc problems, the surgeon removes the  portion of the disc that is causing the pressure on the nerve root. Some surgeons perform a micro (small) discectomy, which may require removal of only a small portion of the lamina. A disc nucleus (center) may also be removed either through a needle (percutaneous discectomy) or by injecting an enzyme called chymopapain into the disc. Chymopapain is an enzyme that dissolves the disc. For people with back instability, the surgeon fuses vertebrae that are next to each other with tiny pieces of bone. These are used as bone grafts on the facets, or between the vertebrae. When this heals, the bones will no longer be able to move. These bone chips are often taken from the pelvic bones. Bones and bone grafts grow into one unit, stabilizing the segments of the spinal column. LET YOUR CAREGIVER KNOW ABOUT:  Allergies.  Medicines taken including herbs, eyedrops, over-the-counter medicines, and creams.  Use of steroids (by mouth or creams).  Previous problems with anesthetics or numbing medicine.  Possibility of pregnancy, if this applies.  History of blood clots (thrombophlebitis).  History of bleeding or blood problems.  Previous surgery.  Other health problems. RISKS AND COMPLICATIONS Your caregiver will discuss possible risks and complications with you before surgery. In addition to the usual risks of anesthesia, other common risks and complications include:  Blood loss and replacement.  Temporary increase in pain due to surgery.  Uncorrected back pain.  Infection.  New nerve damage (tingling, numbness, and pain). BEFORE THE PROCEDURE  Stop smoking at least 1 week prior to surgery. This lowers risk during surgery.  Your  caregiver may advise that you stop taking certain medicines that may affect the outcome of the surgery and your ability to heal. For example, you may need to stop taking anti-inflammatories, such as aspirin, because of possible bleeding problems. Other medicines may have  interactions with anesthesia.  Tell your caregiver if you have been on steroids for long periods of time. Often, additional steroids are administered intravenously before and during the procedure to prevent complications.  You should be present 60 minutes prior to your procedure or as directed. AFTER THE PROCEDURE After surgery, you will be taken to the recovery area where a nurse will watch and check your progress. Generally, you will be allowed to go home within 1 week barring other problems. HOME CARE INSTRUCTIONS   Check the surgical cut (incision) twice a day for signs of infection. Some signs may include a bad smelling, greenish or yellowish discharge from the wound; increased pain or increased redness over the incision site; an opening of the incision; flu-like symptoms; or a temperature above 101.5 F (38.6 C).  Change your bandages in about 24 to 36 hours following surgery or as directed.  You may shower once the bandage is removed or as directed. Avoid bathtubs, swimming pools, and hot tubs for 3 weeks or until your incision has healed completely. If you have stitches (sutures) or staples they may be removed 2 to 3 weeks after surgery, or as directed by your caregiver.  Follow your caregiver's instructions for activities, exercises, and physical therapy.  Weight reduction may be beneficial if you are overweight.  Walking is permitted. You may use a treadmill without an incline. Cut down on activities if you have discomfort. You may also go up and down stairs as tolerated.  Do not lift anything heavier than 10 to 15 pounds. Avoid bending or twisting at the waist. Always bend your knees.  Maintain strength and range of motion as instructed.  No driving is permitted for 2 to 3 weeks, or as directed by your caregiver. You may be a passenger for 20 to 30 minute trips. Lying back in the passenger seat may be more comfortable for you.  Limit your sitting to 20 to 30 minute intervals.  You should lie down or walk in between sitting periods. There are no limitations for sitting in a recliner chair.  Only take over-the-counter or prescription medicines for pain, discomfort, or fever as directed by your caregiver. SEEK MEDICAL CARE IF:   There is increased bleeding (more than a small spot) from the wound.  You notice redness, swelling, or increasing pain in the wound.  Pus is coming from the wound.  An unexplained oral temperature above 102 F (38.9 C) develops.  You notice a bad smell coming from the wound or dressing. SEEK IMMEDIATE MEDICAL CARE IF:   You develop a rash.  You have difficulty breathing.  You have any allergic problems. Document Released: 07/29/2000 Document Revised: 12/16/2013 Document Reviewed: 05/27/2008 [facility] Patient Information 2015 [place], [place]. This information is not intended to replace advice given to you by your health care provider. Make sure you discuss any questions you have with your health care provider.

## 2014-09-09 NOTE — Discharge Summary (Signed)
Physician Discharge Summary  Patient ID: Manuel Harper MRN: [id number] DOB/AGE: 21-Jun-1956 59 y.o.  Admit date: 09/08/2014 Discharge date: 09/09/2014  Admission Diagnoses:  lumbar stenosis with neurogenic claudication, lumbar spondylosis, lumbar degenerative disc disease  Discharge Diagnoses:  lumbar stenosis with neurogenic claudication, lumbar spondylosis, lumbar degenerative disc disease Active Problems:   Lumbar stenosis with neurogenic claudication   Discharged Condition: good  Hospital Course: Patient was admitted, underwent a bilateral L4-5 lumbar laminotomy and medial facetectomy, with foraminotomies for the exiting L4 and L5 nerve roots bilaterally. Postoperatively he has done well. He is up and living actively. Moderate incisional discomfort, controlled with medication; good relief of neurogenic claudication. Dressing removed, wound clean and dry, no swelling, erythema, or drainage. We'll discharge patient to home, instructions regarding wound care and activities given, patient to return for follow-up in 3 weeks.  Discharge Exam: Blood pressure 104/54, pulse 94, temperature 98.7 F (37.1 C), temperature source Oral, resp. rate 18, weight 82.101 kg (181 lb), SpO2 97 %.  Disposition: Home     Medication List    TAKE these medications        acetaminophen 500 MG tablet  Commonly known as:  TYLENOL  Take 1,000 mg by mouth 2 (two) times daily.     BIOFREEZE EX  Apply 1 application topically daily as needed (muscle aches).     Menthol (Topical Analgesic) 6 % Gel  Apply topically.     cyclobenzaprine 10 MG tablet  Commonly known as:  FLEXERIL  Take 0.5-1 tablets (5-10 mg total) by mouth 3 (three) times daily as needed for muscle spasms.     diphenhydramine-acetaminophen 25-500 MG Tabs  Commonly known as:  TYLENOL PM  Take 1 tablet by mouth at bedtime.     lovastatin 40 MG tablet  Commonly known as:  MEVACOR  Take 40 mg by mouth at bedtime.     meloxicam 15 MG  tablet  Commonly known as:  MOBIC  Take 15 mg by mouth daily.     oxyCODONE-acetaminophen 5-325 MG per tablet  Commonly known as:  PERCOCET/ROXICET  Take 1-2 tablets by mouth every 4 (four) hours as needed for moderate pain.     valACYclovir 500 MG tablet  Commonly known as:  VALTREX  Take 500 mg by mouth daily as needed.         Signed: Hosie Spangle, MD 09/09/2014, 8:11 AM

## 2014-09-11 NOTE — Anesthesia Postprocedure Evaluation (Signed)
Anesthesia Post Note  Patient: Manuel Harper  Procedure(s) Performed: Procedure(s) (LRB): LUMBAR LAMINECTOMY/DECOMPRESSION MICRODISCECTOMY 1 LEVEL (Bilateral)  Anesthesia type: General  Patient location: PACU  Post pain: Pain level controlled and Adequate analgesia  Post assessment: Post-op Vital signs reviewed, Patient's Cardiovascular Status Stable, Respiratory Function Stable, Patent Airway and Pain level controlled  Last Vitals:  Filed Vitals:   09/09/14 0751  BP: 104/54  Pulse: 94  Temp: 37.1 C  Resp: 18    Post vital signs: Reviewed and stable  Level of consciousness: awake, alert  and oriented  Complications: No apparent anesthesia complications

## 2016-05-04 ENCOUNTER — Ambulatory Visit: Payer: Self-pay

## 2016-05-04 ENCOUNTER — Ambulatory Visit (INDEPENDENT_AMBULATORY_CARE_PROVIDER_SITE_OTHER): Payer: 59

## 2016-05-04 ENCOUNTER — Ambulatory Visit (INDEPENDENT_AMBULATORY_CARE_PROVIDER_SITE_OTHER): Payer: 59 | Admitting: Podiatry

## 2016-05-04 ENCOUNTER — Encounter: Payer: Self-pay | Admitting: Podiatry

## 2016-05-04 VITALS — BP 115/71 | HR 93 | Resp 16 | Ht 72.0 in | Wt 178.0 lb

## 2016-05-04 DIAGNOSIS — M79672 Pain in left foot: Secondary | ICD-10-CM

## 2016-05-04 DIAGNOSIS — M722 Plantar fascial fibromatosis: Secondary | ICD-10-CM

## 2016-05-04 DIAGNOSIS — M21619 Bunion of unspecified foot: Secondary | ICD-10-CM

## 2016-05-04 DIAGNOSIS — M79671 Pain in right foot: Secondary | ICD-10-CM | POA: Diagnosis not present

## 2016-05-04 MED ORDER — TRIAMCINOLONE ACETONIDE 10 MG/ML IJ SUSP
10.0000 mg | Freq: Once | INTRAMUSCULAR | Status: AC
  Administered 2016-05-04: 10 mg

## 2016-05-04 NOTE — Progress Notes (Signed)
   Subjective:    Patient ID: Manuel Harper, male    DOB: [birth year], 60 y.o.   MRN: [id number]  HPI Chief Complaint  Patient presents with  . Foot Pain    Bilateral; Bunions; Right foot-heel; pt stated, "hurts more in the morning";       Review of Systems  Musculoskeletal: Positive for arthralgias and gait problem.  Neurological: Positive for numbness.  All other systems reviewed and are negative.      Objective:   Physical Exam        Assessment & Plan:

## 2016-05-04 NOTE — Patient Instructions (Addendum)
Plantar Fasciitis (Heel Spur Syndrome) with Rehab The plantar fascia is a fibrous, ligament-like, soft-tissue structure that spans the bottom of the foot. Plantar fasciitis is a condition that causes pain in the foot due to inflammation of the tissue. SYMPTOMS   Pain and tenderness on the underneath side of the foot.  Pain that worsens with standing or walking. CAUSES  Plantar fasciitis is caused by irritation and injury to the plantar fascia on the underneath side of the foot. Common mechanisms of injury include:  Direct trauma to bottom of the foot.  Damage to a small nerve that runs under the foot where the main fascia attaches to the heel bone.  Stress placed on the plantar fascia due to bone spurs. RISK INCREASES WITH:   Activities that place stress on the plantar fascia (running, jumping, pivoting, or cutting).  Poor strength and flexibility.  Improperly fitted shoes.  Tight calf muscles.  Flat feet.  Failure to warm-up properly before activity.  Obesity. PREVENTION  Warm up and stretch properly before activity.  Allow for adequate recovery between workouts.  Maintain physical fitness:  Strength, flexibility, and endurance.  Cardiovascular fitness.  Maintain a health body weight.  Avoid stress on the plantar fascia.  Wear properly fitted shoes, including arch supports for individuals who have flat feet.  PROGNOSIS  If treated properly, then the symptoms of plantar fasciitis usually resolve without surgery. However, occasionally surgery is necessary.  RELATED COMPLICATIONS   Recurrent symptoms that may result in a chronic condition.  Problems of the lower back that are caused by compensating for the injury, such as limping.  Pain or weakness of the foot during push-off following surgery.  Chronic inflammation, scarring, and partial or complete fascia tear, occurring more often from repeated injections.  TREATMENT  Treatment initially involves the  use of ice and medication to help reduce pain and inflammation. The use of strengthening and stretching exercises may help reduce pain with activity, especially stretches of the Achilles tendon. These exercises may be performed at home or with a therapist. Your caregiver may recommend that you use heel cups of arch supports to help reduce stress on the plantar fascia. Occasionally, corticosteroid injections are given to reduce inflammation. If symptoms persist for greater than 6 months despite non-surgical (conservative), then surgery may be recommended.   MEDICATION   If pain medication is necessary, then nonsteroidal anti-inflammatory medications, such as aspirin and ibuprofen, or other minor pain relievers, such as acetaminophen, are often recommended.  Do not take pain medication within 7 days before surgery.  Prescription pain relievers may be given if deemed necessary by your caregiver. Use only as directed and only as much as you need.  Corticosteroid injections may be given by your caregiver. These injections should be reserved for the most serious cases, because they may only be given a certain number of times.  HEAT AND COLD  Cold treatment (icing) relieves pain and reduces inflammation. Cold treatment should be applied for 10 to 15 minutes every 2 to 3 hours for inflammation and pain and immediately after any activity that aggravates your symptoms. Use ice packs or massage the area with a piece of ice (ice massage).  Heat treatment may be used prior to performing the stretching and strengthening activities prescribed by your caregiver, physical therapist, or athletic trainer. Use a heat pack or soak the injury in warm water.  SEEK IMMEDIATE MEDICAL CARE IF:  Treatment seems to offer no benefit, or the condition worsens.  Any medications   produce adverse side effects.  EXERCISES- RANGE OF MOTION (ROM) AND STRETCHING EXERCISES - Plantar Fasciitis (Heel Spur Syndrome) These exercises  may help you when beginning to rehabilitate your injury. Your symptoms may resolve with or without further involvement from your physician, physical therapist or athletic trainer. While completing these exercises, remember:   Restoring tissue flexibility helps normal motion to return to the joints. This allows healthier, less painful movement and activity.  An effective stretch should be held for at least 30 seconds.  A stretch should never be painful. You should only feel a gentle lengthening or release in the stretched tissue.  RANGE OF MOTION - Toe Extension, Flexion  Sit with your right / left leg crossed over your opposite knee.  Grasp your toes and gently pull them back toward the top of your foot. You should feel a stretch on the bottom of your toes and/or foot.  Hold this stretch for 10 seconds.  Now, gently pull your toes toward the bottom of your foot. You should feel a stretch on the top of your toes and or foot.  Hold this stretch for 10 seconds. Repeat  times. Complete this stretch 3 times per day.   RANGE OF MOTION - Ankle Dorsiflexion, Active Assisted  Remove shoes and sit on a chair that is preferably not on a carpeted surface.  Place right / left foot under knee. Extend your opposite leg for support.  Keeping your heel down, slide your right / left foot back toward the chair until you feel a stretch at your ankle or calf. If you do not feel a stretch, slide your bottom forward to the edge of the chair, while still keeping your heel down.  Hold this stretch for 10 seconds. Repeat 3 times. Complete this stretch 2 times per day.   STRETCH  Gastroc, Standing  Place hands on wall.  Extend right / left leg, keeping the front knee somewhat bent.  Slightly point your toes inward on your back foot.  Keeping your right / left heel on the floor and your knee straight, shift your weight toward the wall, not allowing your back to arch.  You should feel a gentle stretch  in the right / left calf. Hold this position for 10 seconds. Repeat 3 times. Complete this stretch 2 times per day.  STRETCH  Soleus, Standing  Place hands on wall.  Extend right / left leg, keeping the other knee somewhat bent.  Slightly point your toes inward on your back foot.  Keep your right / left heel on the floor, bend your back knee, and slightly shift your weight over the back leg so that you feel a gentle stretch deep in your back calf.  Hold this position for 10 seconds. Repeat 3 times. Complete this stretch 2 times per day.  STRETCH  Gastrocsoleus, Standing  Note: This exercise can place a lot of stress on your foot and ankle. Please complete this exercise only if specifically instructed by your caregiver.   Place the ball of your right / left foot on a step, keeping your other foot firmly on the same step.  Hold on to the wall or a rail for balance.  Slowly lift your other foot, allowing your body weight to press your heel down over the edge of the step.  You should feel a stretch in your right / left calf.  Hold this position for 10 seconds.  Repeat this exercise with a slight bend in your right /   left knee. Repeat 3 times. Complete this stretch 2 times per day.   STRENGTHENING EXERCISES - Plantar Fasciitis (Heel Spur Syndrome)  These exercises may help you when beginning to rehabilitate your injury. They may resolve your symptoms with or without further involvement from your physician, physical therapist or athletic trainer. While completing these exercises, remember:   Muscles can gain both the endurance and the strength needed for everyday activities through controlled exercises.  Complete these exercises as instructed by your physician, physical therapist or athletic trainer. Progress the resistance and repetitions only as guided.  STRENGTH - Towel Curls  Sit in a chair positioned on a non-carpeted surface.  Place your foot on a towel, keeping your heel  on the floor.  Pull the towel toward your heel by only curling your toes. Keep your heel on the floor. Repeat 3 times. Complete this exercise 2 times per day.  STRENGTH - Ankle Inversion  Secure one end of a rubber exercise band/tubing to a fixed object (table, pole). Loop the other end around your foot just before your toes.  Place your fists between your knees. This will focus your strengthening at your ankle.  Slowly, pull your big toe up and in, making sure the band/tubing is positioned to resist the entire motion.  Hold this position for 10 seconds.  Have your muscles resist the band/tubing as it slowly pulls your foot back to the starting position. Repeat 3 times. Complete this exercises 2 times per day.  Document Released: 08/01/2005 Document Revised: 10/24/2011 Document Reviewed: 11/13/2008 [facility] Patient Information 2014 [place], [place]. Bunionectomy A bunionectomy is a surgical procedure to remove a bunion. A bunion is a visible bump of bone on the inside of your foot where your big toe meets the rest of your foot. A bunion can develop when pressure turns this bone (first metatarsal) toward the other toes. Shoes that are too tight are the most common cause of bunions. Bunions can also be caused by diseases, such as arthritis and polio. You may need a bunionectomy if your bunion is very large and painful or it affects your ability to walk. LET [facility] CARE PROVIDER KNOW ABOUT:  Any allergies you have.  All medicines you are taking, including vitamins, herbs, eye drops, creams, and over-the-counter medicines.  Previous problems you or members of your family have had with the use of anesthetics.  Any blood disorders you have.  Previous surgeries you have had.  Medical conditions you have. RISKS AND COMPLICATIONS  Generally, this is a safe procedure. However, problems may occur, including:  Infection.  Pain.  Nerve damage.  Bleeding or blood clots.  Reactions  to medicines.  Numbness, stiffness, or arthritis in your toe.  Foot problems that continue even after the procedure. BEFORE THE PROCEDURE  Ask your health care provider about:  Changing or stopping your regular medicines. This is especially important if you are taking diabetes medicines or blood thinners.  Taking medicines such as aspirin and ibuprofen. These medicines can thin your blood. Do not take these medicines before your procedure if your health care provider instructs you not to.  Do not drink alcohol before the procedure as directed by your health care provider.  Do not use tobacco products, including cigarettes, chewing tobacco, or electronic cigarettes, before the procedure as directed by your health care provider. If you need help quitting, ask your health care provider.  Ask your health care provider what kind of medicine you will be given during your procedure.  A bunionectomy may be done using one of these:  A medicine that numbs the area (local anesthetic).  A medicine that makes you go to sleep (general anesthetic). If you will be given general anesthetic, do not eat or drink anything after midnight on the night before the procedure or as directed by your health care provider. PROCEDURE  An IV tube may be inserted into a vein.  You will be given local anesthetic or general anesthetic.  The surgeon will make a cut (incision) over the enlarged area at the first joint of the big toe. The surgeon will remove the bunion.  You may have more than one incision if any of the bones in your big toe need to be moved. A bone itself may need to be cut.  Sometimes the tissues around the big toe may also need to be cut then tightened or loosened to reposition the toe.  Screws or other hardware may be used to keep your foot in thecorrect position.  The incision will be closed with stitches (sutures) and covered with adhesive strips or another type of bandage (dressing). AFTER  THE PROCEDURE  You may spend some time in a recovery area.  Your blood pressure, heart rate, breathing rate, and blood oxygen level will be monitored often until the medicines you were given have worn off.   This information is not intended to replace advice given to you by your health care provider. Make sure you discuss any questions you have with your health care provider.   Document Released: 07/15/2005 Document Revised: 04/22/2015 Document Reviewed: 03/19/2014 Elsevier Interactive Patient Education 2016 [place] Instructions  Congratulations, you have decided to take an important step to improving your quality of life.  You can be assured that the doctors of [facility] will be with you every step of the way.  1. Plan to be at the surgery center/hospital at least 1 (one) hour prior to your scheduled time unless otherwise directed by the surgical center/hospital staff.  You must have a responsible adult accompany you, remain during the surgery and drive you home.  Make sure you have directions to the surgical center/hospital and know how to get there on time. 2. For hospital based surgery you will need to obtain a history and physical form from your family physician within 1 month prior to the date of surgery- we will give you a form for you primary physician.  3. We make every effort to accommodate the date you request for surgery.  There are however, times where surgery dates or times have to be moved.  We will contact you as soon as possible if a change in schedule is required.   4. No Aspirin/Ibuprofen for one week before surgery.  If you are on aspirin, any non-steroidal anti-inflammatory medications (Mobic, Aleve, Ibuprofen) you should stop taking it 7 days prior to your surgery.  You make take Tylenol  For pain prior to surgery.  5. Medications- If you are taking daily heart and blood pressure medications, seizure, reflux, allergy, asthma, anxiety, pain or  diabetes medications, make sure the surgery center/hospital is aware before the day of surgery so they may notify you which medications to take or avoid the day of surgery. 6. No food or drink after midnight the night before surgery unless directed otherwise by surgical center/hospital staff. 7. No alcoholic beverages 24 hours prior to surgery.  No smoking 24 hours prior to or 24 hours after surgery. 8. Wear  loose pants or shorts- loose enough to fit over bandages, boots, and casts. 9. No slip on shoes, sneakers are best. 10. Bring your boot with you to the surgery center/hospital.  Also bring crutches or a walker if your physician has prescribed it for you.  If you do not have this equipment, it will be provided for you after surgery. 11. If you have not been contracted by the surgery center/hospital by the day before your surgery, call to confirm the date and time of your surgery. 12. Leave-time from work may vary depending on the type of surgery you have.  Appropriate arrangements should be made prior to surgery with your employer. 13. Prescriptions will be provided immediately following surgery by your doctor.  Have these filled as soon as possible after surgery and take the medication as directed. 14. Remove nail polish on the operative foot. 15. Wash the night before surgery.  The night before surgery wash the foot and leg well with the antibacterial soap provided and water paying special attention to beneath the toenails and in between the toes.  Rinse thoroughly with water and dry well with a towel.  Perform this wash unless told not to do so by your physician.  Enclosed: 1 Ice pack (please put in freezer the night before surgery)   1 Hibiclens skin cleaner   Pre-op Instructions  If you have any questions regarding the instructions, do not hesitate to call our office.  [place]: [place], [place] [address] [place]: [address], [place], [place] [address]  [phone]  [place]: [address], [place] [address] [phone]   Dr. Ila Mcgill DPM, Dr. Celesta Gentile DPM, Dr. Lanelle Bal DPM, Dr. Landis Martins DPM

## 2016-05-06 NOTE — Progress Notes (Signed)
Subjective:     Patient ID: Manuel Harper, male   DOB: Nov 10, 1955, 60 y.o.   MRN: [id number]  HPI patient reports stating these had a lot of heel pain right and has significant structural bunion deformity left over right that he's tried to wear wider shoe gear for at least trying to accommodate with padding without relief of symptoms. Patient states the heel is intensity discomforting and the bunions been hurting for a lot longer time   Review of Systems  All other systems reviewed and are negative.      Objective:   Physical Exam  Constitutional: He is oriented to person, place, and time.  Cardiovascular: Intact distal pulses.   Musculoskeletal: Normal range of motion.  Neurological: He is oriented to person, place, and time.  Skin: Skin is warm.  Nursing note and vitals reviewed.  neurovascular status intact muscle strength adequate range of motion within normal limits with patient noted to have exquisite discomfort plantar aspect right heel at the insertional point of the tendon into the calcaneus with fluid buildup and is noted to have hyperostosis medial aspect first metatarsal left over right with redness around the joint surface. Patient has good digital perfusion and is well oriented 3     Assessment:     Structural HAV deformity left over right with pain and plantar fascial discomfort at the insertion calcaneus right with depression of the arch noted    Plan:     H&P and all conditions reviewed and discussed different treatment options. At this point she he wants to get the bunions fixed and I discussed osteotomies but we'll hold off on that and a focus on the right heel. I injected the plantar fascial right 3 mg Kenalog 5 mg Xylocaine and gave instructions on physical therapy dispensed fascial brace placed on oral anti-inflammatories and reappoint to recheck again in 2 weeks  X-ray report indicates that there is elevation of the intermetatarsal angle of approximately 15  bilateral with deviation of the hallux against the second toe and small spur plantar heel

## 2016-05-18 ENCOUNTER — Ambulatory Visit (INDEPENDENT_AMBULATORY_CARE_PROVIDER_SITE_OTHER): Payer: 59 | Admitting: Podiatry

## 2016-05-18 ENCOUNTER — Encounter: Payer: Self-pay | Admitting: Podiatry

## 2016-05-18 DIAGNOSIS — M722 Plantar fascial fibromatosis: Secondary | ICD-10-CM | POA: Diagnosis not present

## 2016-05-18 DIAGNOSIS — M21619 Bunion of unspecified foot: Secondary | ICD-10-CM | POA: Diagnosis not present

## 2016-05-18 MED ORDER — TRIAMCINOLONE ACETONIDE 10 MG/ML IJ SUSP
10.0000 mg | Freq: Once | INTRAMUSCULAR | Status: AC
  Administered 2016-05-18: 10 mg

## 2016-05-19 NOTE — Progress Notes (Signed)
Subjective:     Patient ID: Manuel Harper, male   DOB: 22-Mar-1956, 60 y.o.   MRN: [id number]  HPI patient presents stating my heel is still hurting underneath and I will probably need orthotics   Review of Systems     Objective:   Physical Exam Neurovascular status intact the pain is more in the center to center outside of the heel at this time with inflammation fluid still noted with the medial side of the heel doing quite a bit better    Assessment:     Inflammatory fasciitis improving medial with pain central and lateral    Plan:     H&P condition reviewed and injected from the lateral side 3 [place] bunions Marcaine and went ahead and scanned for custom orthotics to lift the arch and take stress off the arch. Reappoint to recheck

## 2016-05-30 ENCOUNTER — Telehealth: Payer: Self-pay | Admitting: *Deleted

## 2016-05-30 NOTE — Telephone Encounter (Signed)
I left a message for patient to give me a call back.  I need to verify surgery date.  Is it supposed to be scheduled for 06/21/2016 or 06/28/2016?

## 2016-05-31 NOTE — Telephone Encounter (Signed)
I left messages for patient to call me back.  I need to find out if his surgery is supposed to be scheduled for 06/21/2016 or 06/28/2016.

## 2016-06-02 NOTE — Telephone Encounter (Signed)
"  I attempted to call you back on yesterday.  We've been playing phone tag."  Yes, thanks for calling me back.  We have you a conflict. We have you scheduled for surgery on 2 days.  Are you supposed to be scheduled for November 7 or 14?  "I am supposed to be scheduled for November 14."  Okay, thank you so much that is all I needed.

## 2016-06-06 ENCOUNTER — Encounter: Payer: Self-pay | Admitting: Podiatry

## 2016-06-06 ENCOUNTER — Ambulatory Visit (INDEPENDENT_AMBULATORY_CARE_PROVIDER_SITE_OTHER): Payer: 59 | Admitting: Podiatry

## 2016-06-06 DIAGNOSIS — M722 Plantar fascial fibromatosis: Secondary | ICD-10-CM | POA: Diagnosis not present

## 2016-06-06 DIAGNOSIS — M21619 Bunion of unspecified foot: Secondary | ICD-10-CM | POA: Diagnosis not present

## 2016-06-06 NOTE — Progress Notes (Signed)
Subjective:     Patient ID: Manuel Harper, male   DOB: 12-12-1955, 60 y.o.   MRN: [id number]  HPI patient presents stating that most likely long-term will need a second pair of orthotics in his heel has been feeling reasonably good with mild pain and is ready to get the structural bunion corrected right over left and wants to get this done in the middle of November with date already scheduled on the calendar   Review of Systems     Objective:   Physical Exam Neurovascular status intact with significant structural bunion deformity and redness first metatarsal right over left with patient having already tried wider shoes soaks and pads without relief of symptoms. Plantar heel mildly tender but improved quite a bit    Assessment:     Structural HAV deformity right over left with deviation the hallux against the second toe and moderate plantar fasciitis improved    Plan:     H&P x-rays reviewed condition discussed and at this point I do think it'll take an aggressive distal osteotomy with the possibility will not get full radiographic correction but clinically I do think it'll do well. Patient wants this instead of a more extensive type procedure understanding risk and I allowed him to read consent form going over alternative treatments and complications as listed. Patient wants surgery and after extensive review signs consent form and is given all preoperative instructions and boot to wear postoperatively. Patient is encouraged to call with any instructions and understands recovery can be approximate 6 months to one year

## 2016-06-06 NOTE — Patient Instructions (Addendum)
WEARING INSTRUCTIONS FOR ORTHOTICS  Don't expect to be comfortable wearing your orthotic devices for the first time.  Like eyeglasses, you may be aware of them as time passes, they will not be uncomfortable and you will enjoy wearing them.  FOLLOW THESE INSTRUCTIONS EXACTLY!  1. Wear your orthotic devices for:       Not more than 1 hour the first day.       Not more than 2 hours the second day.       Not more than 3 hours the third day and so on.        Or wear them for as long as they feel comfortable.       If you experience discomfort in your feet or legs take them out.  When feet & legs feel       better, put them back in.  You do need to be consistent and wear them a little        everyday. 2.   If at any time the orthotic devices become acutely uncomfortable before the       time for that particular day, STOP WEARING THEM. 3.   On the next day, do not increase the wearing time. 4.   Subsequently, increase the wearing time by 15-30 minutes only if comfortable to do       so. 5.   You will be seen by your doctor about 2-4 weeks after you receive your orthotic       devices, at which time you will probably be wearing your devices comfortably        for about 8 hours or more a day. 6.   Some patients occasionally report mild aches or discomfort in other parts of the of       body such as the knees, hips or back after 3 or 4 consecutive hours of wear.  If this       is the case with you, do not extend your wearing time.  Instead, cut it back an hour or       two.  In all likelihood, these symptoms will disappear in a short period of time as your       body posture realigns itself and functions more efficiently. 7.   It is possible that your orthotic device may require some small changes or adjustment       to improve their function or make them more comfortable.   This is usually not done       before one to three months have elapsed.  These adjustments are made in        accordance  with the changed position your feet are assuming as a result of       improved biomechanical function. 8.   In women's shoes, it's not unusual for your heel to slip out of the shoe, particularly if       they are step-in-shoes.  If this is the case, try other shoes or other styles.  Try to       purchase shoes which have deeper heal seats or higher heel counters. 9.   Squeaking of orthotics devices in the shoes is due to the movement of the devices       when they are functioning normally.  To eliminate squeaking, simply dust some       baby powder into your shoes before inserting the devices.  If this does not work,          apply soap or wax to the edges of the orthotic devices or put a tissue into the shoes. 10. It is important that you follow these directions explicitly.  Failure to do so will simply       prolong the adjustment period or create problems which are easily avoided.  It makes       no difference if you are wearing your orthotic devices for only a few hours after        several months, so long as you are wearing them comfortably for those hours. 11. If you have any questions or complaints, contact our office.  We have no way of       knowing about your problems unless you tell us.  If we do not hear from you, we will       assume that you are proceeding well. Pre-Operative Instructions  Congratulations, you have decided to take an important step to improving your quality of life.  You can be assured that the doctors of [facility] will be with you every step of the way.  2. Plan to be at the surgery center/hospital at least 1 (one) hour prior to your scheduled time unless otherwise directed by the surgical center/hospital staff.  You must have a responsible adult accompany you, remain during the surgery and drive you home.  Make sure you have directions to the surgical center/hospital and know how to get there on time. 3. For hospital based surgery you will need to obtain a  history and physical form from your family physician within 1 month prior to the date of surgery- we will give you a form for you primary physician.  4. We make every effort to accommodate the date you request for surgery.  There are however, times where surgery dates or times have to be moved.  We will contact you as soon as possible if a change in schedule is required.   5. No Aspirin/Ibuprofen for one week before surgery.  If you are on aspirin, any non-steroidal anti-inflammatory medications (Mobic, Aleve, Ibuprofen) you should stop taking it 7 days prior to your surgery.  You make take Tylenol  For pain prior to surgery.  6. Medications- If you are taking daily heart and blood pressure medications, seizure, reflux, allergy, asthma, anxiety, pain or diabetes medications, make sure the surgery center/hospital is aware before the day of surgery so they may notify you which medications to take or avoid the day of surgery. 7. No food or drink after midnight the night before surgery unless directed otherwise by surgical center/hospital staff. 8. No alcoholic beverages 24 hours prior to surgery.  No smoking 24 hours prior to or 24 hours after surgery. 9. Wear loose pants or shorts- loose enough to fit over bandages, boots, and casts. 10. No slip on shoes, sneakers are best. 11. Bring your boot with you to the surgery center/hospital.  Also bring crutches or a walker if your physician has prescribed it for you.  If you do not have this equipment, it will be provided for you after surgery. 12. If you have not been contracted by the surgery center/hospital by the day before your surgery, call to confirm the date and time of your surgery. 74. Leave-time from work may vary depending on the type of surgery you have.  Appropriate arrangements should be made prior to surgery with your employer. 14. Prescriptions will be provided immediately following surgery by your doctor.  Have these filled as soon  as possible  after surgery and take the medication as directed. 15. Remove nail polish on the operative foot. 16. Wash the night before surgery.  The night before surgery wash the foot and leg well with the antibacterial soap provided and water paying special attention to beneath the toenails and in between the toes.  Rinse thoroughly with water and dry well with a towel.  Perform this wash unless told not to do so by your physician.  Enclosed: 1 Ice pack (please put in freezer the night before surgery)   1 Hibiclens skin cleaner   Pre-op Instructions  If you have any questions regarding the instructions, do not hesitate to call our office.  [place]: [place], [place] [address] [place]: [address] [place], [place] [address] [phone]  [place]: [address],  [address] [phone]   Dr. Ila Mcgill DPM, Dr. Celesta Gentile DPM, Dr. Lanelle Bal DPM, Dr. Landis Martins DPM

## 2016-06-23 ENCOUNTER — Telehealth: Payer: Self-pay | Admitting: *Deleted

## 2016-06-23 NOTE — Telephone Encounter (Signed)
"  I am calling regarding my surgery that is scheduled for next Tuesday.  I hope it's not an issue.  Give me a call to discuss."  This is my second time I have called today.  I want to be sure you are getting this message.  Call me please."    I am returning your call.  Would you like to reschedule your surgery?  "No, I don't want to do it at this time.  I have a lot going on.  I just want to wait until the beginning of the year.  I will call back when I am ready."  I will cancel surgery and inform Dr. Paulla Dolly and Dr. Amalia Hailey. (unblock [name]'s schedule)  I called and left a message for [name] to cancel patient's surgery for 07/05/2016.

## 2016-10-24 DIAGNOSIS — M7542 Impingement syndrome of left shoulder: Secondary | ICD-10-CM | POA: Diagnosis not present

## 2016-11-03 DIAGNOSIS — R142 Eructation: Secondary | ICD-10-CM | POA: Diagnosis not present

## 2016-11-03 DIAGNOSIS — R110 Nausea: Secondary | ICD-10-CM | POA: Diagnosis not present

## 2016-11-15 ENCOUNTER — Other Ambulatory Visit: Payer: Self-pay | Admitting: Family Medicine

## 2016-11-15 DIAGNOSIS — R110 Nausea: Secondary | ICD-10-CM

## 2016-11-15 DIAGNOSIS — R142 Eructation: Secondary | ICD-10-CM

## 2016-11-24 ENCOUNTER — Ambulatory Visit
Admission: RE | Admit: 2016-11-24 | Discharge: 2016-11-24 | Disposition: A | Discharge status: Home / Self Care | Payer: 59 | Source: Ambulatory Visit | Attending: Family Medicine | Admitting: Family Medicine

## 2016-11-24 DIAGNOSIS — R142 Eructation: Secondary | ICD-10-CM

## 2016-11-24 DIAGNOSIS — R1013 Epigastric pain: Secondary | ICD-10-CM | POA: Diagnosis not present

## 2016-11-24 DIAGNOSIS — R110 Nausea: Secondary | ICD-10-CM

## 2016-12-06 DIAGNOSIS — D225 Melanocytic nevi of trunk: Secondary | ICD-10-CM | POA: Diagnosis not present

## 2016-12-06 DIAGNOSIS — L570 Actinic keratosis: Secondary | ICD-10-CM | POA: Diagnosis not present

## 2016-12-06 DIAGNOSIS — Z85828 Personal history of other malignant neoplasm of skin: Secondary | ICD-10-CM | POA: Diagnosis not present

## 2016-12-12 DIAGNOSIS — K219 Gastro-esophageal reflux disease without esophagitis: Secondary | ICD-10-CM | POA: Diagnosis not present

## 2017-01-12 DIAGNOSIS — Z86010 Personal history of colonic polyps: Secondary | ICD-10-CM | POA: Diagnosis not present

## 2017-01-12 DIAGNOSIS — K30 Functional dyspepsia: Secondary | ICD-10-CM | POA: Diagnosis not present

## 2017-01-12 DIAGNOSIS — K2930 Chronic superficial gastritis without bleeding: Secondary | ICD-10-CM | POA: Diagnosis not present

## 2017-03-06 DIAGNOSIS — E7800 Pure hypercholesterolemia, unspecified: Secondary | ICD-10-CM | POA: Diagnosis not present

## 2017-03-06 DIAGNOSIS — Z0000 Encounter for general adult medical examination without abnormal findings: Secondary | ICD-10-CM | POA: Diagnosis not present

## 2017-04-07 DIAGNOSIS — M170 Bilateral primary osteoarthritis of knee: Secondary | ICD-10-CM | POA: Diagnosis not present

## 2017-04-20 DIAGNOSIS — M546 Pain in thoracic spine: Secondary | ICD-10-CM | POA: Diagnosis not present

## 2017-04-20 DIAGNOSIS — Z9889 Other specified postprocedural states: Secondary | ICD-10-CM | POA: Diagnosis not present

## 2017-04-20 DIAGNOSIS — M5136 Other intervertebral disc degeneration, lumbar region: Secondary | ICD-10-CM | POA: Diagnosis not present

## 2017-04-27 DIAGNOSIS — M4726 Other spondylosis with radiculopathy, lumbar region: Secondary | ICD-10-CM | POA: Diagnosis not present

## 2017-04-27 DIAGNOSIS — M5126 Other intervertebral disc displacement, lumbar region: Secondary | ICD-10-CM | POA: Diagnosis not present

## 2017-04-27 DIAGNOSIS — Z9889 Other specified postprocedural states: Secondary | ICD-10-CM | POA: Diagnosis not present

## 2017-04-27 DIAGNOSIS — M48061 Spinal stenosis, lumbar region without neurogenic claudication: Secondary | ICD-10-CM | POA: Diagnosis not present

## 2017-05-02 DIAGNOSIS — M48061 Spinal stenosis, lumbar region without neurogenic claudication: Secondary | ICD-10-CM | POA: Diagnosis not present

## 2017-05-02 DIAGNOSIS — M5116 Intervertebral disc disorders with radiculopathy, lumbar region: Secondary | ICD-10-CM | POA: Diagnosis not present

## 2017-05-02 DIAGNOSIS — M4726 Other spondylosis with radiculopathy, lumbar region: Secondary | ICD-10-CM | POA: Diagnosis not present

## 2017-05-10 DIAGNOSIS — M1712 Unilateral primary osteoarthritis, left knee: Secondary | ICD-10-CM | POA: Diagnosis not present

## 2017-05-15 DIAGNOSIS — M25532 Pain in left wrist: Secondary | ICD-10-CM | POA: Diagnosis not present

## 2017-05-17 DIAGNOSIS — M1712 Unilateral primary osteoarthritis, left knee: Secondary | ICD-10-CM | POA: Diagnosis not present

## 2017-05-24 DIAGNOSIS — M1712 Unilateral primary osteoarthritis, left knee: Secondary | ICD-10-CM | POA: Diagnosis not present

## 2017-06-06 DIAGNOSIS — Z23 Encounter for immunization: Secondary | ICD-10-CM | POA: Diagnosis not present

## 2017-06-12 ENCOUNTER — Other Ambulatory Visit: Payer: Self-pay | Admitting: Neurosurgery

## 2017-06-12 DIAGNOSIS — M48062 Spinal stenosis, lumbar region with neurogenic claudication: Secondary | ICD-10-CM | POA: Diagnosis not present

## 2017-06-12 DIAGNOSIS — M5126 Other intervertebral disc displacement, lumbar region: Secondary | ICD-10-CM | POA: Diagnosis not present

## 2017-06-12 DIAGNOSIS — M5136 Other intervertebral disc degeneration, lumbar region: Secondary | ICD-10-CM | POA: Diagnosis not present

## 2017-06-16 NOTE — Pre-Procedure Instructions (Signed)
CEDRICK PARTAIN  06/16/2017      [id number] [address], [place] [address] Phone: [phone] Fax: [phone]    Your procedure is scheduled on Wednesday November 7.  Report to [facility] Admitting at 6:30 A.M.  Call this number if you have problems the morning of surgery:  [phone]   Remember:  Do not eat food or drink liquids after midnight.  Take these medicines the morning of surgery with A SIP OF WATER: pantoprazole (protonix)  7 days prior to surgery STOP taking any Aspirin (unless otherwise instructed by your surgeon), Aleve, Naproxen, Ibuprofen, Motrin, Advil, Goody's, BC's, all herbal medications, fish oil, and all vitamins    Do not wear jewelry, make-up or nail polish.  Do not wear lotions, powders, or perfumes, or deoderant.  Do not shave 48 hours prior to surgery.  Men may shave face and neck.  Do not bring valuables to the hospital.  [facility] is not responsible for any belongings or valuables.  Contacts, dentures or bridgework may not be worn into surgery.  Leave your suitcase in the car.  After surgery it may be brought to your room.  For patients admitted to the hospital, discharge time will be determined by your treatment team.  Patients discharged the day of surgery will not be allowed to drive home.   Special instructions:    [facility]- Preparing For Surgery  Before surgery, you can play an important role. Because skin is not sterile, your skin needs to be as free of germs as possible. You can reduce the number of germs on your skin by washing with CHG (chlorahexidine gluconate) Soap before surgery.  CHG is an antiseptic cleaner which kills germs and bonds with the skin to continue killing germs even after washing.  Please do not use if you have an allergy to CHG or antibacterial soaps. If your skin becomes reddened/irritated stop using the CHG.  Do not shave  (including legs and underarms) for at least 48 hours prior to first CHG shower. It is OK to shave your face.  Please follow these instructions carefully.   1. Shower the NIGHT BEFORE SURGERY and the MORNING OF SURGERY with CHG.   2. If you chose to wash your hair, wash your hair first as usual with your normal shampoo.  3. After you shampoo, rinse your hair and body thoroughly to remove the shampoo.  4. Use CHG as you would any other liquid soap. You can apply CHG directly to the skin and wash gently with a scrungie or a clean washcloth.   5. Apply the CHG Soap to your body ONLY FROM THE NECK DOWN.  Do not use on open wounds or open sores. Avoid contact with your eyes, ears, mouth and genitals (private parts). Wash Face and genitals (private parts)  with your normal soap.  6. Wash thoroughly, paying special attention to the area where your surgery will be performed.  7. Thoroughly rinse your body with warm water from the neck down.  8. DO NOT shower/wash with your normal soap after using and rinsing off the CHG Soap.  9. Pat yourself dry with a CLEAN TOWEL.  10. Wear CLEAN PAJAMAS to bed the night before surgery, wear comfortable clothes the morning of surgery  11. Place CLEAN SHEETS on your bed the night of your first shower and DO NOT SLEEP WITH PETS.    Day of Surgery:  Do not apply any deodorants/lotions. Please wear clean clothes to the hospital/surgery center.      Please read over the following fact sheets that you were given. Coughing and Deep Breathing, MRSA Information and Surgical Site Infection Prevention

## 2017-06-19 ENCOUNTER — Other Ambulatory Visit: Payer: Self-pay

## 2017-06-19 ENCOUNTER — Encounter (HOSPITAL_COMMUNITY)
Admission: RE | Admit: 2017-06-19 | Discharge: 2017-06-19 | Disposition: A | Discharge status: Home / Self Care | Payer: 59 | Source: Ambulatory Visit | Attending: Neurosurgery | Admitting: Neurosurgery

## 2017-06-19 ENCOUNTER — Encounter (HOSPITAL_COMMUNITY): Payer: Self-pay

## 2017-06-19 HISTORY — DX: Personal history of other infectious and parasitic diseases: Z86.19

## 2017-06-19 HISTORY — DX: Hyperlipidemia, unspecified: E78.5

## 2017-06-19 LAB — BASIC METABOLIC PANEL
Anion gap: 8 (ref 5–15)
BUN: 12 mg/dL (ref 6–20)
CHLORIDE: 104 mmol/L (ref 101–111)
CO2: 28 mmol/L (ref 22–32)
Calcium: 9.3 mg/dL (ref 8.9–10.3)
Creatinine, Ser: 1.18 mg/dL (ref 0.61–1.24)
Glucose, Bld: 97 mg/dL (ref 65–99)
POTASSIUM: 4.2 mmol/L (ref 3.5–5.1)
SODIUM: 140 mmol/L (ref 135–145)

## 2017-06-19 LAB — CBC
HCT: 36.5 % — ABNORMAL LOW (ref 39.0–52.0)
Hemoglobin: 12.2 g/dL — ABNORMAL LOW (ref 13.0–17.0)
MCH: 30.6 pg (ref 26.0–34.0)
MCHC: 33.4 g/dL (ref 30.0–36.0)
MCV: 91.5 fL (ref 78.0–100.0)
Platelets: 223 10*3/uL (ref 150–400)
RBC: 3.99 MIL/uL — AB (ref 4.22–5.81)
RDW: 13.6 % (ref 11.5–15.5)
WBC: 6.7 10*3/uL (ref 4.0–10.5)

## 2017-06-19 LAB — TYPE AND SCREEN
ABO/RH(D): O NEG
Antibody Screen: NEGATIVE

## 2017-06-19 LAB — ABO/RH: ABO/RH(D): O NEG

## 2017-06-19 LAB — SURGICAL PCR SCREEN
MRSA, PCR: NEGATIVE
Staphylococcus aureus: NEGATIVE

## 2017-06-19 NOTE — Progress Notes (Signed)
PCP: Dr. Marisue Humble, [place] [place] Cardiologist: Denies  Patient denies shortness of breath, fever, cough, and chest pain at PAT appointment.  Patient verbalized understanding of instructions provided today at the PAT appointment.  Patient asked to review instructions at home and day of surgery.

## 2017-06-21 ENCOUNTER — Encounter (HOSPITAL_COMMUNITY): Payer: Self-pay

## 2017-06-21 ENCOUNTER — Inpatient Hospital Stay (HOSPITAL_COMMUNITY)
Admission: RE | Admit: 2017-06-21 | Discharge: 2017-06-22 | DRG: 455 | Disposition: A | Discharge status: Home / Self Care | Payer: 59 | Source: Ambulatory Visit | Attending: Neurosurgery | Admitting: Neurosurgery

## 2017-06-21 ENCOUNTER — Inpatient Hospital Stay (HOSPITAL_COMMUNITY): Payer: 59 | Admitting: Certified Registered Nurse Anesthetist

## 2017-06-21 ENCOUNTER — Encounter (HOSPITAL_COMMUNITY)
Admission: RE | Disposition: A | Discharge status: Home / Self Care | Payer: Self-pay | Source: Ambulatory Visit | Attending: Neurosurgery

## 2017-06-21 ENCOUNTER — Inpatient Hospital Stay (HOSPITAL_COMMUNITY): Payer: 59

## 2017-06-21 DIAGNOSIS — E785 Hyperlipidemia, unspecified: Secondary | ICD-10-CM | POA: Diagnosis present

## 2017-06-21 DIAGNOSIS — M5116 Intervertebral disc disorders with radiculopathy, lumbar region: Secondary | ICD-10-CM | POA: Diagnosis not present

## 2017-06-21 DIAGNOSIS — M4726 Other spondylosis with radiculopathy, lumbar region: Secondary | ICD-10-CM | POA: Diagnosis present

## 2017-06-21 DIAGNOSIS — M48062 Spinal stenosis, lumbar region with neurogenic claudication: Secondary | ICD-10-CM | POA: Diagnosis present

## 2017-06-21 DIAGNOSIS — M48061 Spinal stenosis, lumbar region without neurogenic claudication: Secondary | ICD-10-CM | POA: Diagnosis not present

## 2017-06-21 DIAGNOSIS — Z419 Encounter for procedure for purposes other than remedying health state, unspecified: Secondary | ICD-10-CM

## 2017-06-21 DIAGNOSIS — K219 Gastro-esophageal reflux disease without esophagitis: Secondary | ICD-10-CM | POA: Diagnosis present

## 2017-06-21 DIAGNOSIS — F17290 Nicotine dependence, other tobacco product, uncomplicated: Secondary | ICD-10-CM | POA: Diagnosis present

## 2017-06-21 SURGERY — POSTERIOR LUMBAR FUSION 2 LEVEL
Anesthesia: General | Site: Back

## 2017-06-21 MED ORDER — THROMBIN (RECOMBINANT) 5000 UNITS EX SOLR
CUTANEOUS | Status: AC
  Filled 2017-06-21: qty 5000

## 2017-06-21 MED ORDER — ACETAMINOPHEN 10 MG/ML IV SOLN
INTRAVENOUS | Status: AC
  Filled 2017-06-21: qty 100

## 2017-06-21 MED ORDER — ACETAMINOPHEN 325 MG PO TABS: 650.0000 mg | ORAL_TABLET | ORAL | Status: DC | PRN

## 2017-06-21 MED ORDER — FENTANYL CITRATE (PF) 100 MCG/2ML IJ SOLN
25.0000 ug | INTRAMUSCULAR | Status: DC | PRN
  Administered 2017-06-21 (×2): 50 ug via INTRAVENOUS

## 2017-06-21 MED ORDER — OXYCODONE HCL 5 MG PO TABS: 5.0000 mg | ORAL_TABLET | Freq: Once | ORAL | Status: DC | PRN

## 2017-06-21 MED ORDER — ONDANSETRON HCL 4 MG/2ML IJ SOLN: 4.0000 mg | Freq: Once | INTRAMUSCULAR | Status: DC | PRN

## 2017-06-21 MED ORDER — LIDOCAINE-EPINEPHRINE 1 %-1:100000 IJ SOLN
INTRAMUSCULAR | Status: DC | PRN
  Administered 2017-06-21: 15 mL

## 2017-06-21 MED ORDER — KETOROLAC TROMETHAMINE 30 MG/ML IJ SOLN
INTRAMUSCULAR | Status: AC
  Filled 2017-06-21: qty 1

## 2017-06-21 MED ORDER — ONDANSETRON HCL 4 MG/2ML IJ SOLN: 4.0000 mg | Freq: Four times a day (QID) | INTRAMUSCULAR | Status: DC | PRN

## 2017-06-21 MED ORDER — OXYCODONE HCL 5 MG/5ML PO SOLN: 5.0000 mg | Freq: Once | ORAL | Status: DC | PRN

## 2017-06-21 MED ORDER — THROMBIN (RECOMBINANT) 20000 UNITS EX SOLR
CUTANEOUS | Status: DC | PRN
  Administered 2017-06-21: 20000 [IU] via TOPICAL

## 2017-06-21 MED ORDER — BUPIVACAINE HCL (PF) 0.5 % IJ SOLN
INTRAMUSCULAR | Status: DC | PRN
  Administered 2017-06-21: 15 mL

## 2017-06-21 MED ORDER — ACETAMINOPHEN 10 MG/ML IV SOLN
INTRAVENOUS | Status: DC | PRN
  Administered 2017-06-21: 1000 mg via INTRAVENOUS

## 2017-06-21 MED ORDER — MIDAZOLAM HCL 5 MG/5ML IJ SOLN
INTRAMUSCULAR | Status: DC | PRN
  Administered 2017-06-21: 2 mg via INTRAVENOUS

## 2017-06-21 MED ORDER — THROMBIN (RECOMBINANT) 20000 UNITS EX SOLR
CUTANEOUS | Status: AC
  Filled 2017-06-21: qty 20000

## 2017-06-21 MED ORDER — DEXTROSE 5 % IV SOLN
INTRAVENOUS | Status: DC | PRN
  Administered 2017-06-21: 15 ug/min via INTRAVENOUS

## 2017-06-21 MED ORDER — KCL IN DEXTROSE-NACL 20-5-0.45 MEQ/L-%-% IV SOLN: INTRAVENOUS | Status: DC

## 2017-06-21 MED ORDER — ROCURONIUM BROMIDE 100 MG/10ML IV SOLN
INTRAVENOUS | Status: DC | PRN
  Administered 2017-06-21: 20 mg via INTRAVENOUS
  Administered 2017-06-21 (×2): 50 mg via INTRAVENOUS
  Administered 2017-06-21 (×2): 20 mg via INTRAVENOUS

## 2017-06-21 MED ORDER — MIDAZOLAM HCL 2 MG/2ML IJ SOLN
INTRAMUSCULAR | Status: AC
  Filled 2017-06-21: qty 2

## 2017-06-21 MED ORDER — SODIUM CHLORIDE 0.9% FLUSH
3.0000 mL | Freq: Two times a day (BID) | INTRAVENOUS | Status: DC
  Administered 2017-06-21: 3 mL via INTRAVENOUS

## 2017-06-21 MED ORDER — ALUM & MAG HYDROXIDE-SIMETH 200-200-20 MG/5ML PO SUSP: 30.0000 mL | Freq: Four times a day (QID) | ORAL | Status: DC | PRN

## 2017-06-21 MED ORDER — ARTIFICIAL TEARS OPHTHALMIC OINT
TOPICAL_OINTMENT | OPHTHALMIC | Status: DC | PRN
  Administered 2017-06-21: 1 via OPHTHALMIC

## 2017-06-21 MED ORDER — SUGAMMADEX SODIUM 200 MG/2ML IV SOLN
INTRAVENOUS | Status: DC | PRN
  Administered 2017-06-21: 161.2 mg via INTRAVENOUS

## 2017-06-21 MED ORDER — ONDANSETRON HCL 4 MG/2ML IJ SOLN
INTRAMUSCULAR | Status: DC | PRN
  Administered 2017-06-21: 4 mg via INTRAVENOUS

## 2017-06-21 MED ORDER — FENTANYL CITRATE (PF) 250 MCG/5ML IJ SOLN
INTRAMUSCULAR | Status: AC
  Filled 2017-06-21: qty 5

## 2017-06-21 MED ORDER — ONDANSETRON HCL 4 MG PO TABS: 4.0000 mg | ORAL_TABLET | Freq: Four times a day (QID) | ORAL | Status: DC | PRN

## 2017-06-21 MED ORDER — PHENYLEPHRINE HCL 10 MG/ML IJ SOLN
INTRAMUSCULAR | Status: DC | PRN
  Administered 2017-06-21: 40 ug via INTRAVENOUS

## 2017-06-21 MED ORDER — ACETAMINOPHEN 650 MG RE SUPP: 650.0000 mg | RECTAL | Status: DC | PRN

## 2017-06-21 MED ORDER — PROPOFOL 10 MG/ML IV BOLUS
INTRAVENOUS | Status: DC | PRN
  Administered 2017-06-21: 160 mg via INTRAVENOUS

## 2017-06-21 MED ORDER — SODIUM CHLORIDE 0.9% FLUSH: 3.0000 mL | INTRAVENOUS | Status: DC | PRN

## 2017-06-21 MED ORDER — CYCLOBENZAPRINE HCL 10 MG PO TABS
ORAL_TABLET | ORAL | Status: AC
  Filled 2017-06-21: qty 1

## 2017-06-21 MED ORDER — CHLORHEXIDINE GLUCONATE CLOTH 2 % EX PADS: 6.0000 | MEDICATED_PAD | Freq: Once | CUTANEOUS | Status: DC

## 2017-06-21 MED ORDER — KETOROLAC TROMETHAMINE 30 MG/ML IJ SOLN
30.0000 mg | Freq: Four times a day (QID) | INTRAMUSCULAR | Status: DC
  Administered 2017-06-21 – 2017-06-22 (×3): 30 mg via INTRAVENOUS
  Filled 2017-06-21 (×3): qty 1

## 2017-06-21 MED ORDER — BACITRACIN 50000 UNITS IM SOLR
INTRAMUSCULAR | Status: DC | PRN
  Administered 2017-06-21: 11:00:00

## 2017-06-21 MED ORDER — BISACODYL 10 MG RE SUPP: 10.0000 mg | Freq: Every day | RECTAL | Status: DC | PRN

## 2017-06-21 MED ORDER — HYDROXYZINE HCL 50 MG/ML IM SOLN: 50.0000 mg | INTRAMUSCULAR | Status: DC | PRN

## 2017-06-21 MED ORDER — MORPHINE SULFATE (PF) 4 MG/ML IV SOLN: 4.0000 mg | INTRAVENOUS | Status: DC | PRN

## 2017-06-21 MED ORDER — FLEET ENEMA 7-19 GM/118ML RE ENEM: 1.0000 | ENEMA | Freq: Once | RECTAL | Status: DC | PRN

## 2017-06-21 MED ORDER — FENTANYL CITRATE (PF) 100 MCG/2ML IJ SOLN
INTRAMUSCULAR | Status: DC | PRN
  Administered 2017-06-21 (×3): 50 ug via INTRAVENOUS
  Administered 2017-06-21: 100 ug via INTRAVENOUS
  Administered 2017-06-21 (×5): 50 ug via INTRAVENOUS

## 2017-06-21 MED ORDER — HYDROCODONE-ACETAMINOPHEN 5-325 MG PO TABS
ORAL_TABLET | ORAL | Status: AC
  Filled 2017-06-21: qty 2

## 2017-06-21 MED ORDER — HYDROXYZINE HCL 25 MG PO TABS: 50.0000 mg | ORAL_TABLET | ORAL | Status: DC | PRN

## 2017-06-21 MED ORDER — BUPIVACAINE HCL (PF) 0.5 % IJ SOLN
INTRAMUSCULAR | Status: AC
  Filled 2017-06-21: qty 30

## 2017-06-21 MED ORDER — PANTOPRAZOLE SODIUM 40 MG PO TBEC
40.0000 mg | DELAYED_RELEASE_TABLET | Freq: Two times a day (BID) | ORAL | Status: DC
  Administered 2017-06-21 – 2017-06-22 (×2): 40 mg via ORAL
  Filled 2017-06-21 (×2): qty 1

## 2017-06-21 MED ORDER — CEFAZOLIN SODIUM-DEXTROSE 2-4 GM/100ML-% IV SOLN
2.0000 g | INTRAVENOUS | Status: AC
  Administered 2017-06-21 (×2): 2 g via INTRAVENOUS
  Filled 2017-06-21: qty 100

## 2017-06-21 MED ORDER — PHENOL 1.4 % MT LIQD
1.0000 | OROMUCOSAL | Status: DC | PRN
  Administered 2017-06-21: 1 via OROMUCOSAL
  Filled 2017-06-21: qty 177

## 2017-06-21 MED ORDER — CYCLOBENZAPRINE HCL 5 MG PO TABS
5.0000 mg | ORAL_TABLET | Freq: Three times a day (TID) | ORAL | Status: DC | PRN
  Administered 2017-06-21: 10 mg via ORAL

## 2017-06-21 MED ORDER — LIDOCAINE-EPINEPHRINE 1 %-1:100000 IJ SOLN
INTRAMUSCULAR | Status: AC
  Filled 2017-06-21: qty 1

## 2017-06-21 MED ORDER — DEXAMETHASONE SODIUM PHOSPHATE 4 MG/ML IJ SOLN
INTRAMUSCULAR | Status: DC | PRN
  Administered 2017-06-21: 10 mg via INTRAVENOUS

## 2017-06-21 MED ORDER — MENTHOL 3 MG MT LOZG
1.0000 | LOZENGE | OROMUCOSAL | Status: DC | PRN
  Filled 2017-06-21: qty 9

## 2017-06-21 MED ORDER — LACTATED RINGERS IV SOLN
INTRAVENOUS | Status: DC | PRN
  Administered 2017-06-21 (×2): via INTRAVENOUS

## 2017-06-21 MED ORDER — MAGNESIUM HYDROXIDE 400 MG/5ML PO SUSP: 30.0000 mL | Freq: Every day | ORAL | Status: DC | PRN

## 2017-06-21 MED ORDER — 0.9 % SODIUM CHLORIDE (POUR BTL) OPTIME
TOPICAL | Status: DC | PRN
  Administered 2017-06-21 (×2): 1000 mL

## 2017-06-21 MED ORDER — SCOPOLAMINE 1 MG/3DAYS TD PT72
MEDICATED_PATCH | TRANSDERMAL | Status: DC | PRN
  Administered 2017-06-21: 1 via TRANSDERMAL

## 2017-06-21 MED ORDER — PROPOFOL 10 MG/ML IV BOLUS
INTRAVENOUS | Status: AC
  Filled 2017-06-21: qty 20

## 2017-06-21 MED ORDER — HEMOSTATIC AGENTS (NO CHARGE) OPTIME
TOPICAL | Status: DC | PRN
  Administered 2017-06-21: 1 via TOPICAL

## 2017-06-21 MED ORDER — LIDOCAINE HCL (CARDIAC) 20 MG/ML IV SOLN
INTRAVENOUS | Status: DC | PRN
  Administered 2017-06-21: 40 mg via INTRAVENOUS

## 2017-06-21 MED ORDER — FENTANYL CITRATE (PF) 100 MCG/2ML IJ SOLN
INTRAMUSCULAR | Status: AC
  Filled 2017-06-21: qty 2

## 2017-06-21 MED ORDER — KETOROLAC TROMETHAMINE 30 MG/ML IJ SOLN
30.0000 mg | Freq: Once | INTRAMUSCULAR | Status: AC
Start: 2017-06-21 — End: 2017-06-21
  Administered 2017-06-21: 30 mg via INTRAVENOUS

## 2017-06-21 MED ORDER — HYDROCODONE-ACETAMINOPHEN 5-325 MG PO TABS
1.0000 | ORAL_TABLET | ORAL | Status: DC | PRN
  Administered 2017-06-21 – 2017-06-22 (×4): 2 via ORAL
  Filled 2017-06-21 (×3): qty 2

## 2017-06-21 SURGICAL SUPPLY — 86 items
ADH SKN CLS APL DERMABOND .7 (GAUZE/BANDAGES/DRESSINGS) ×1
APL SKNCLS STERI-STRIP NONHPOA (GAUZE/BANDAGES/DRESSINGS)
AVS WEDGENOSE 10X25X4-8 (Spacer) ×4 IMPLANT
BAG DECANTER FOR FLEXI CONT (MISCELLANEOUS) ×2 IMPLANT
BENZOIN TINCTURE PRP APPL 2/3 (GAUZE/BANDAGES/DRESSINGS) IMPLANT
BLADE CLIPPER SURG (BLADE) IMPLANT
BLADE SURG 10 STRL SS (BLADE) ×2 IMPLANT
BUR ACRON 5.0MM COATED (BURR) ×2 IMPLANT
BUR MATCHSTICK NEURO 3.0 LAGG (BURR) ×2 IMPLANT
CANISTER SUCT 3000ML PPV (MISCELLANEOUS) ×2 IMPLANT
CAP LCK SPNE (Orthopedic Implant) ×6 IMPLANT
CAP LOCK SPINE RADIUS (Orthopedic Implant) IMPLANT
CAP LOCKING (Orthopedic Implant) ×12 IMPLANT
CARTRIDGE OIL MAESTRO DRILL (MISCELLANEOUS) ×1 IMPLANT
CONT SPEC 4OZ CLIKSEAL STRL BL (MISCELLANEOUS) ×2 IMPLANT
COVER BACK TABLE 60X90IN (DRAPES) ×2 IMPLANT
CROSSLINK MEDIUM (Orthopedic Implant) ×1 IMPLANT
DERMABOND ADVANCED (GAUZE/BANDAGES/DRESSINGS) ×1
DERMABOND ADVANCED .7 DNX12 (GAUZE/BANDAGES/DRESSINGS) ×2 IMPLANT
DIFFUSER DRILL AIR PNEUMATIC (MISCELLANEOUS) ×2 IMPLANT
DRAPE C-ARM 42X72 X-RAY (DRAPES) ×3 IMPLANT
DRAPE C-ARMOR (DRAPES) ×1 IMPLANT
DRAPE HALF SHEET 40X57 (DRAPES) ×1 IMPLANT
DRAPE LAPAROTOMY 100X72X124 (DRAPES) ×2 IMPLANT
DRAPE POUCH INSTRU U-SHP 10X18 (DRAPES) ×2 IMPLANT
ELECT REM PT RETURN 9FT ADLT (ELECTROSURGICAL) ×2
ELECTRODE REM PT RTRN 9FT ADLT (ELECTROSURGICAL) ×1 IMPLANT
GAUZE SPONGE 4X4 12PLY STRL (GAUZE/BANDAGES/DRESSINGS) ×2 IMPLANT
GAUZE SPONGE 4X4 16PLY XRAY LF (GAUZE/BANDAGES/DRESSINGS) ×1 IMPLANT
GLOVE BIOGEL PI IND STRL 7.0 (GLOVE) IMPLANT
GLOVE BIOGEL PI IND STRL 7.5 (GLOVE) IMPLANT
GLOVE BIOGEL PI IND STRL 8 (GLOVE) ×2 IMPLANT
GLOVE BIOGEL PI INDICATOR 7.0 (GLOVE) ×1
GLOVE BIOGEL PI INDICATOR 7.5 (GLOVE) ×1
GLOVE BIOGEL PI INDICATOR 8 (GLOVE) ×2
GLOVE ECLIPSE 6.5 STRL STRAW (GLOVE) ×1 IMPLANT
GLOVE ECLIPSE 7.5 STRL STRAW (GLOVE) ×4 IMPLANT
GLOVE SURG SS PI 7.0 STRL IVOR (GLOVE) ×5 IMPLANT
GLOVE SURG SS PI 7.5 STRL IVOR (GLOVE) ×1 IMPLANT
GOWN STRL REUS W/ TWL LRG LVL3 (GOWN DISPOSABLE) IMPLANT
GOWN STRL REUS W/ TWL XL LVL3 (GOWN DISPOSABLE) ×2 IMPLANT
GOWN STRL REUS W/TWL 2XL LVL3 (GOWN DISPOSABLE) IMPLANT
GOWN STRL REUS W/TWL LRG LVL3 (GOWN DISPOSABLE) ×4
GOWN STRL REUS W/TWL XL LVL3 (GOWN DISPOSABLE) ×6
KIT BASIN OR (CUSTOM PROCEDURE TRAY) ×2 IMPLANT
KIT INFUSE SMALL (Orthopedic Implant) ×1 IMPLANT
KIT ROOM TURNOVER OR (KITS) ×2 IMPLANT
NDL 18GX1X1/2 (RX/OR ONLY) (NEEDLE) ×1 IMPLANT
NDL ASP BONE MRW 8GX15 (NEEDLE) IMPLANT
NDL SPNL 18GX3.5 QUINCKE PK (NEEDLE) ×1 IMPLANT
NDL SPNL 22GX3.5 QUINCKE BK (NEEDLE) ×1 IMPLANT
NEEDLE 18GX1X1/2 (RX/OR ONLY) (NEEDLE) IMPLANT
NEEDLE ASP BONE MRW 8GX15 (NEEDLE) ×2 IMPLANT
NEEDLE SPNL 18GX3.5 QUINCKE PK (NEEDLE) ×4 IMPLANT
NEEDLE SPNL 22GX3.5 QUINCKE BK (NEEDLE) ×2 IMPLANT
NS IRRIG 1000ML POUR BTL (IV SOLUTION) ×3 IMPLANT
OIL CARTRIDGE MAESTRO DRILL (MISCELLANEOUS) ×2
PACK LAMINECTOMY NEURO (CUSTOM PROCEDURE TRAY) ×2 IMPLANT
PAD ARMBOARD 7.5X6 YLW CONV (MISCELLANEOUS) ×6 IMPLANT
PATTIES SURGICAL .5 X.5 (GAUZE/BANDAGES/DRESSINGS) IMPLANT
PATTIES SURGICAL .5 X1 (DISPOSABLE) IMPLANT
PATTIES SURGICAL 1X1 (DISPOSABLE) IMPLANT
ROD 70MM (Rod) ×4 IMPLANT
ROD SPNL 70X5.5XNS TI RDS (Rod) IMPLANT
SCREW 5.75X45MM (Screw) ×6 IMPLANT
SPACER AVS WEDGENOSE 10X25X4-8 (Spacer) IMPLANT
SPACER SPINAL 9X25X4-8D PEEK (Spacer) ×2 IMPLANT
SPONGE LAP 4X18 X RAY DECT (DISPOSABLE) IMPLANT
SPONGE NEURO XRAY DETECT 1X3 (DISPOSABLE) ×1 IMPLANT
SPONGE SURGIFOAM ABS GEL 100 (HEMOSTASIS) IMPLANT
STAPLER SKIN PROX WIDE 3.9 (STAPLE) IMPLANT
STRIP BIOACTIVE VITOSS 25X100X (Neuro Prosthesis/Implant) ×2 IMPLANT
STRIP CLOSURE SKIN 1/2X4 (GAUZE/BANDAGES/DRESSINGS) IMPLANT
SUT PROLENE 6 0 BV (SUTURE) IMPLANT
SUT VIC AB 1 CT1 18XBRD ANBCTR (SUTURE) ×2 IMPLANT
SUT VIC AB 1 CT1 8-18 (SUTURE) ×6
SUT VIC AB 2-0 CP2 18 (SUTURE) ×5 IMPLANT
SYR 3ML LL SCALE MARK (SYRINGE) ×5 IMPLANT
SYR 5ML LL (SYRINGE) IMPLANT
SYR CONTROL 10ML LL (SYRINGE) ×2 IMPLANT
TAPE CLOTH SURG 4X10 WHT LF (GAUZE/BANDAGES/DRESSINGS) ×1 IMPLANT
TOWEL GREEN STERILE (TOWEL DISPOSABLE) ×2 IMPLANT
TOWEL GREEN STERILE FF (TOWEL DISPOSABLE) ×2 IMPLANT
TRAP SPECIMEN MUCOUS 40CC (MISCELLANEOUS) ×1 IMPLANT
TRAY FOLEY W/METER SILVER 16FR (SET/KITS/TRAYS/PACK) ×2 IMPLANT
WATER STERILE IRR 1000ML POUR (IV SOLUTION) ×2 IMPLANT

## 2017-06-21 NOTE — Transfer of Care (Signed)
Immediate Anesthesia Transfer of Care Note  Patient: Manuel Harper  Procedure(s) Performed: LUMBAR THREE- LUMBAR FOUR, LUMBAR FOUR- LUMBAR FIVE DECOMPRESSION, POSTERIOR LUMBAR INTERBODY FUSION, POSTERIOR LATERAL ARTHRODESIS (N/A Back)  Patient Location: PACU  Anesthesia Type:General  Level of Consciousness: awake, alert  and oriented  Airway & Oxygen Therapy: Patient Spontanous Breathing and Patient connected to nasal cannula oxygen  Post-op Assessment: Report given to RN and Post -op Vital signs reviewed and stable  Post vital signs: Reviewed and stable  Last Vitals:  Vitals:   06/21/17 0658 06/21/17 1444  BP: (!) 144/89 116/79  Pulse: 89 (!) 108  Resp: 18 12  Temp: 36.6 C (!) 36.1 C  SpO2: 100% 100%    Last Pain:  Vitals:   06/21/17 1444  TempSrc:   PainSc: 4       Patients Stated Pain Goal: 3 (28/11/88 6773)  Complications: No apparent anesthesia complications

## 2017-06-21 NOTE — Op Note (Signed)
06/21/2017  2:33 PM  PATIENT:  Manuel Harper  61 y.o. 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, lumbar instability, lumbar spondylosis, lumbar degenerative disease, lumbar radiculopathy  PROCEDURE:  Procedure(s): Lumbar decompression including L3, L4, and L5 decompressive lumbar laminectomy, bilateral L3-4 and L4-5 facetectomy, and bilateral L3, L4, L5 foraminotomies, bilateral L3-4 and L4-5 microdiscectomy, with decompression of central canal, lateral recess, and neural foraminal stenosis, with decompression beyond that required for interbody arthrodesis; lumbar stabilization including bilateral L3-4 and L4-5 posterior lumbar interbody arthrodesis with AVS peek interbody implants and Vitoss BA with bone marrow aspirate and infuse and bilateral L3-L5 posterior lateral arthrodesis with segmental radius posterior instrumentation, locally harvested morselized autograft, Vitoss BA with bone marrow aspirate, and infuse  SURGEON: Jovita Gamma, MD  ASSISTANTS: Ashok Pall, MD  ANESTHESIA:   general  EBL:  Total I/O In: 1840 [I.V.:1700; Blood:140] Out: 1250 [Urine:850; Blood:400]  BLOOD ADMINISTERED:140 CC CELLSAVER  COUNT: Correct per nursing staff  DICTATION: Patient was brought to the operating room placed under general endotracheal anesthesia. The patient was turned to prone position, the lumbar region was prepped with Betadine soap and solution and draped in a sterile fashion. The midline was infiltrated with local anesthesia with epinephrine. A localizing x-ray was taken and then a midline incision was made through the previous midline incision, and extended rostrally.  It was  carried down through the subcutaneous tissue, bipolar cautery and electrocautery were used to maintain hemostasis. Dissection was carried down to the lumbar fascia. The fascia was incised bilaterally and the paraspinal muscles were dissected with a spinous process and lamina in a subperiosteal fashion.  Care was taken where the previous bilateral L4-5 laminotomies and foraminotomies had been performed.  Another x-ray was taken for localization and the L3, L4, and L5 with levels was localized. Dissection was then carried out laterally over the facet complexes and the transverse processes of L3, L4, and L5 were exposed and decorticated.   We then proceeded with the decompression.  The margins of the previous laminotomy and foraminotomy were defined at the L4-5 level.  The inferior L3, complete L4, and superior L5 laminectomy was performed using double-action rongeurs, the high-speed drill and Kerrison punches.  We were able to progressively decompress the central canal stenosis.  Dissection was carried out laterally including bilateral L3-4 and L4-5 facetectomies and foraminotomies with decompression of the stenotic compression of the exiting L3, L4, and L5 nerve roots bilaterally. Once the decompression of the stenotic compression of the thecal sac and exiting nerve roots was completed we proceeded with the lateral L4-5 discectomy for the large L4-5 disc aeration extending bilaterally, worse on the right than the left.  The annulus was incised first on the right side at L4-5, and the subligamentous discoloration was removed in a piecemeal fashion progressively decompressing the ventral aspect of the thecal sac and exiting nerve roots.  Further subligamentous disc herniation was removed from the left side.  We then proceeded with the posterior lumbar interbody arthrodesis.  We continued with the intradiscal discectomy at the L4-5 level.  The annulus at the L3-4 level was incised bilaterally and the disc space  entered. A thorough discectomy was performed using pituitary rongeurs and curettes at each level. Once the discectomy was completed we began to prepare the endplate surfaces, removing the  cartilaginous endplates surface. We then measured the height of the intervertebral disc space. We selected a 9 x 25 x 4 degrees AVS peek interbody implants for the L3-4 level, and 10 x 25 x 4 degrees AVS peek interbody implants for the L4-5 level.  The C-arm fluoroscope was then draped and brought in the field and we identified the pedicle entry points bilaterally at the L3, L4, and L5 levels. Each of the 6 pedicles was probed, we aspirated bone marrow aspirate from the vertebral bodies, this was injected over two 10 cc strips of Vitoss BA. Then each of the pedicles was examined with the ball probe, good bony surfaces were found and no bony cuts were found. Each of the pedicles was then tapped with a 5.25 mm tap, again examined with the ball probe good threading was found and no bony cuts were found. We then placed 5.75 by 45 millimeter screws bilaterally at the L3 level, 5.75 by 45 millimeter screws bilaterally at the L4 level, and 5.75 by 45 millimeter screws bilaterally at the L5 level.  We then packed the AVS peek interbody implants with Vitoss BA with bone marrow aspirate and infuse, and then placed the first implant at the L4-5 level on the right side, carefully retracting the thecal sac and nerve root medially. We then went back to the left side and packed the midline with additional Vitoss BA with bone marrow aspirate and infuse, and then placed a second implant on the left side again retracting the thecal sac and nerve root medially. Additional Vitoss BA with bone marrow aspirate was packed lateral to the implants.  Then at the L3-4 level, we placed the first implant on the right side, carefully retracting the thecal sac and nerve root medially. We then went back to the left side and packed the midline with additional  Vitoss BA with bone marrow aspirate and infuse, and then placed a second implant on the left side, again retracting the thecal sac and nerve root medially. Additional Vitoss BA with bone marrow aspirate was packed lateral to the implants.   We then packed the lateral gutter over the transverse processes and intertransverse space with locally harvested morselized autograft, Vitoss BA with bone marrow aspirate, and infuse. We then selected 70 mm pre-lordosed rods, they were placed within the screw heads and secured with locking caps once all 6 locking caps were placed final tightening was performed against a counter torque.  We selected a medium variable cross connector, it was secured to the rods between the screw heads at L4 and L5.  Once it was locked down to both rods, the central locking collar was tightened down.  The wound had been irrigated multiple times during the procedure with saline solution and bacitracin solution, good hemostasis was established with a combination of bipolar cautery and Gelfoam with thrombin. Once good hemostasis was confirmed we proceeded with closure paraspinal muscles deep fascia and Scarpa's fascia were closed with interrupted undyed 1 Vicryl sutures the subcutaneous and subcuticular closed with interrupted inverted 2-0 undyed Vicryl sutures the skin edges were approximated with Dermabond.  The wound was dressed with sterile gauze and Hypafix.  Following surgery the patient was turned back to the supine position to be reversed and the anesthetic extubated and transferred to the recovery room for further care.    PLAN OF CARE: Admit to inpatient   PATIENT DISPOSITION:  PACU - hemodynamically stable.   Delay start of Pharmacological VTE agent (>24hrs) due to surgical blood  loss or risk of bleeding:  yes

## 2017-06-21 NOTE — Anesthesia Preprocedure Evaluation (Addendum)
Anesthesia Evaluation  Patient identified by MRN, date of birth, ID band Patient awake    Reviewed: Allergy & Precautions, NPO status , Patient's Chart, lab work & pertinent test results  Airway Mallampati: II  TM Distance: >3 FB Neck ROM: Full    Dental  (+) Teeth Intact, Dental Advisory Given   Pulmonary Current Smoker,    breath sounds clear to auscultation       Cardiovascular  Rhythm:Regular Rate:Normal     Neuro/Psych    GI/Hepatic   Endo/Other    Renal/GU      Musculoskeletal   Abdominal   Peds  Hematology   Anesthesia Other Findings   Reproductive/Obstetrics                             Anesthesia Physical Anesthesia Plan  ASA: II  Anesthesia Plan: General   Post-op Pain Management:    Induction: Intravenous  PONV Risk Score and Plan: 1  Airway Management Planned: Oral ETT  Additional Equipment:   Intra-op Plan:   Post-operative Plan: Extubation in OR  Informed Consent: I have reviewed the patients History and Physical, chart, labs and discussed the procedure including the risks, benefits and alternatives for the proposed anesthesia with the patient or authorized representative who has indicated his/her understanding and acceptance.   Dental advisory given  Plan Discussed with: CRNA and Anesthesiologist  Anesthesia Plan Comments:         Anesthesia Quick Evaluation

## 2017-06-21 NOTE — Anesthesia Postprocedure Evaluation (Signed)
Anesthesia Post Note  Patient: Manuel Harper  Procedure(s) Performed: LUMBAR THREE- LUMBAR FOUR, LUMBAR FOUR- LUMBAR FIVE DECOMPRESSION, POSTERIOR LUMBAR INTERBODY FUSION, POSTERIOR LATERAL ARTHRODESIS (N/A Back)     Patient location during evaluation: PACU Anesthesia Type: General Level of consciousness: awake, awake and alert and oriented Pain management: pain level controlled Vital Signs Assessment: post-procedure vital signs reviewed and stable Respiratory status: spontaneous breathing, nonlabored ventilation and respiratory function stable Cardiovascular status: blood pressure returned to baseline Anesthetic complications: no    Last Vitals:  Vitals:   06/21/17 1640 06/21/17 2000  BP: 133/89 133/82  Pulse: (!) 115 (!) 120  Resp:  18  Temp: 36.4 C 37.1 C  SpO2: 100% 98%    Last Pain:  Vitals:   06/21/17 2120  TempSrc:   PainSc: 6                  Fable Huisman COKER

## 2017-06-21 NOTE — Progress Notes (Signed)
Vitals:   06/21/17 1558 06/21/17 1613 06/21/17 1618 06/21/17 1640  BP: 130/90 126/88 (!) 130/95 133/89  Pulse: (!) 105 (!) 109 (!) 108 (!) 115  Resp: 12 12 11    Temp:   (!) 97.4 F (36.3 C) 97.6 F (36.4 C)  TempSrc:    Oral  SpO2: 100% 100% 100% 100%  Weight:      Height:        CBC Recent Labs    06/19/17 0854  WBC 6.7  HGB 12.2*  HCT 36.5*  PLT 223   BMET Recent Labs    06/19/17 0854  NA 140  K 4.2  CL 104  CO2 28  GLUCOSE 97  BUN 12  CREATININE 1.18  CALCIUM 9.3    Patient resting comfortably in bed, good relief of radicular pain, mild stiffness in low back. Has already been up and out of bed to the bathroom, and urinated. Dressing clean and dry.  Plan: Encouraged to ambulate at least twice this evening with the staff in the hallways. We'll continue to progress through postoperative recovery.  Hosie Spangle, MD 06/21/2017, 6:44 PM

## 2017-06-21 NOTE — Anesthesia Procedure Notes (Signed)
Procedure Name: Intubation Date/Time: 06/21/2017 8:41 AM Performed by: Clearnce Sorrel, CRNA Pre-anesthesia Checklist: Patient identified, Emergency Drugs available, Suction available, Patient being monitored and Timeout performed Patient Re-evaluated:Patient Re-evaluated prior to induction Oxygen Delivery Method: Circle system utilized Preoxygenation: Pre-oxygenation with 100% oxygen Induction Type: IV induction Ventilation: Mask ventilation without difficulty Laryngoscope Size: Mac and 3 Grade View: Grade I Tube type: Oral Tube size: 7.5 mm Number of attempts: 1 Airway Equipment and Method: Stylet Placement Confirmation: ETT inserted through vocal cords under direct vision,  positive ETCO2 and breath sounds checked- equal and bilateral Secured at: 23 cm Tube secured with: Tape Dental Injury: Teeth and Oropharynx as per pre-operative assessment

## 2017-06-21 NOTE — H&P (Signed)
Subjective: Patient is a 61 y.o. right-handed white male who is admitted for treatment of advanced multilevel lumbar spondylosis, degenerative disease, superimposed disc herniation, with resulting significant multifactorial, multilevel lumbar stenosis at the L3-4 and L4-5 levels. Patient is status post a bilateral L4-5 lumbar laminotomies and foraminotomies in January 2016. He did well from that surgery. He's developed increased difficulties this summer, with increasing low back pain and bilateral lumbar radicular pain right worse than left. He was evaluated with x-rays and MRI scan which showed progressive degeneration at both L3-4 and L4-5 with superimposed disc herniation and resulting stenosis. He is admitted now for L3-L5 decompressive lumbar laminectomy, facetectomy, and foraminotomies, bilateral L3-4 and L4-5 posterior lumbar interbody arthrodesis with interbody implants and bone graft, and bilateral L3-L5 posterior lateral arthrodesis with posterior instrumentation and bone graft.   Patient Active Problem List   Diagnosis Date Noted  . Lumbar stenosis with neurogenic claudication 09/08/2014   Past Medical History:  Diagnosis Date  . Arthritis   . GERD (gastroesophageal reflux disease)   . Hx of cold sores   . Hyperlipidemia     Past Surgical History:  Procedure Laterality Date  . ANTERIOR CRUCIATE LIGAMENT REPAIR Bilateral   . KNEE ARTHROSCOPY Bilateral    over yrs    Medications Prior to Admission  Medication Sig Dispense Refill Last Dose  . bismuth subsalicylate (PEPTO BISMOL) 262 MG/15ML suspension Take 30 mLs by mouth every 6 (six) hours as needed for indigestion.   Past Month at Unknown time  . diphenhydramine-acetaminophen (TYLENOL PM) 25-500 MG TABS tablet Take 1 tablet by mouth at bedtime as needed (sleep).    06/20/2017 at Unknown time  . Glucosamine-Chondroitin (OSTEO BI-FLEX REGULAR STRENGTH PO) Take 1 tablet by mouth 2 (two) times daily.   Past Week at Unknown time  .  guaiFENesin (MUCINEX) 600 MG 12 hr tablet Take 600 mg by mouth daily as needed for cough or to loosen phlegm.   06/20/2017 at Unknown time  . lovastatin (MEVACOR) 40 MG tablet Take 40 mg by mouth daily.   3 06/20/2017 at Unknown time  . MELATONIN PO Take 1 tablet by mouth at bedtime as needed (sleep).   06/20/2017 at Unknown time  . meloxicam (MOBIC) 15 MG tablet Take 15 mg by mouth daily.   11 Past Week at Unknown time  . Multiple Vitamin (MULTIVITAMIN WITH MINERALS) TABS tablet Take 1 tablet by mouth daily.   Past Week at Unknown time  . pantoprazole (PROTONIX) 40 MG tablet Take 40 mg by mouth 2 (two) times daily.   06/21/2017 at 0530  . simethicone (MYLICON) 80 MG chewable tablet Chew 80 mg 4 (four) times daily as needed by mouth for flatulence.   Past Week at Unknown time  . valACYclovir (VALTREX) 1000 MG tablet Take 2,000 mg by mouth 2 (two) times daily as needed (cold sores). For 2 days   Past Month at Unknown time  . Menthol, Topical Analgesic, (BIOFREEZE EX) Apply 1 application topically daily as needed (muscle aches).   More than a month at Unknown time   No Known Allergies  Social History   Tobacco Use  . Smoking status: Current Some Day Smoker    Types: Cigars  . Smokeless tobacco: Never Used  . Tobacco comment: once a year  Substance Use Topics  . Alcohol use: Yes    Alcohol/week: 6.0 oz    Types: 5 Glasses of wine, 5 Cans of beer per week    Comment: 5-6 per week  Family History  Problem Relation Age of Onset  . Depression Mother   . Lung cancer Father   . Alcoholism Brother      Review of Systems A comprehensive review of systems was negative.  Objective: Vital signs in last 24 hours: Temp:  [97.9 F (36.6 C)] 97.9 F (36.6 C) (11/07 0658) Pulse Rate:  [89] 89 (11/07 0658) Resp:  [18] 18 (11/07 0658) BP: (144)/(89) 144/89 (11/07 0658) SpO2:  [100 %] 100 % (11/07 0658) Weight:  [80.6 kg (177 lb 11.2 oz)] 80.6 kg (177 lb 11.2 oz) (11/07 0653)  EXAM: Patient is a  well-developed well-nourished white male in no acute distress. Lungs are clear to auscultation , the patient has symmetrical respiratory excursion. Heart has a regular rate and rhythm normal S1 and S2 no murmur.   Abdomen is soft nontender nondistended bowel sounds are present. Extremity examination shows no clubbing cyanosis or edema. Motor examination shows 5 over 5 strength in the lower extremities including the iliopsoas quadriceps dorsiflexor extensor hallicus  longus and plantar flexor bilaterally. Sensation is intact to pinprick in the distal lower extremities. Reflexes are symmetrical bilaterally. No pathologic reflexes are present. Patient has a normal gait and stance.   Data Review:CBC    Component Value Date/Time   WBC 6.7 06/19/2017 0854   RBC 3.99 (L) 06/19/2017 0854   HGB 12.2 (L) 06/19/2017 0854   HCT 36.5 (L) 06/19/2017 0854   PLT 223 06/19/2017 0854   MCV 91.5 06/19/2017 0854   MCH 30.6 06/19/2017 0854   MCHC 33.4 06/19/2017 0854   RDW 13.6 06/19/2017 0854                          BMET    Component Value Date/Time   NA 140 06/19/2017 0854   K 4.2 06/19/2017 0854   CL 104 06/19/2017 0854   CO2 28 06/19/2017 0854   GLUCOSE 97 06/19/2017 0854   BUN 12 06/19/2017 0854   CREATININE 1.18 06/19/2017 0854   CALCIUM 9.3 06/19/2017 0854   GFRNONAA >60 06/19/2017 0854   GFRAA >60 06/19/2017 0854     Assessment/Plan: Patient with increasingly disabling low back and bowel lumbar radicular pain, right worse than left, with progressive degeneration at the L3-4 and L4-5 levels, who is admitted now for lumbar decompression and stabilization.  I've discussed with the patient the nature of his condition, the nature the surgical procedure, the typical length of surgery, hospital stay, and overall recuperation, the limitations postoperatively, and risks of surgery. I discussed risks including risks of infection, bleeding, possibly need for transfusion, the risk of nerve root  dysfunction with pain, weakness, numbness, or paresthesias, the risk of dural tear and CSF leakage and possible need for further surgery, the risk of failure of the arthrodesis and possibly for further surgery, the risk of anesthetic complications including myocardial infarction, stroke, pneumonia, and death. We discussed the need for postoperative immobilization in a lumbar brace. Understanding all this the patient does wish to proceed with surgery and is admitted for such.     Hosie Spangle, MD 06/21/2017 8:06 AM

## 2017-06-22 MED ORDER — HYDROCODONE-ACETAMINOPHEN 5-325 MG PO TABS: 1.0000 | ORAL_TABLET | ORAL | 0 refills | Status: DC | PRN

## 2017-06-22 MED FILL — Heparin Sodium (Porcine) Inj 1000 Unit/ML: INTRAMUSCULAR | Qty: 30 | Status: AC

## 2017-06-22 MED FILL — Sodium Chloride IV Soln 0.9%: INTRAVENOUS | Qty: 1000 | Status: AC

## 2017-06-22 NOTE — Discharge Summary (Signed)
Physician Discharge Summary  Patient ID: Manuel Harper MRN: [id number] DOB/AGE: 19-Jan-1956 61 y.o.  Admit date: 06/21/2017 Discharge date: 06/22/2017  Admission Diagnoses:  Multilevel, multifactorial lumbar stenosis with neurogenic claudication, L4-5 lumbar disc herniation, lumbar instability, lumbar spondylosis, lumbar degenerative disease, lumbar radiculopathy  Discharge Diagnoses:  Multilevel, multifactorial lumbar stenosis with neurogenic claudication, L4-5 lumbar disc herniation, lumbar instability, lumbar spondylosis, lumbar degenerative disease, lumbar radiculopathy  Active Problems:   Lumbar stenosis with neurogenic claudication   Discharged Condition: good  Hospital Course: Patient was admitted, underwent a L3-4 and L4-5 lumbar decompression and arthrodesis (PLIF and PLA).  Postoperatively he has done well.  He is up and ambulating actively.  He is voiding well.  His dressing is been removed, and his incision is healing nicely.  There is no erythema, swelling, or drainage.  Patient is asking to be discharged home.  He has been given instructions regarding wound care and activities following discharge.  He is scheduled to follow-up with me in the office in about 3 weeks with x-rays.  Discharge Exam: Blood pressure 107/71, pulse 96, temperature 98.7 F (37.1 C), temperature source Oral, resp. rate 18, height 6' (1.829 m), weight 80.6 kg (177 lb 11.2 oz), SpO2 99 %.  Disposition: 01-Home or Self Care  Discharge Instructions    Discharge wound care:   Complete by:  As directed    Leave the wound open to air. Shower daily with the wound uncovered. Water and soapy water should run over the incision area. Do not wash directly on the incision for 2 weeks. Remove the glue after 2 weeks.   Driving Restrictions   Complete by:  As directed    No driving for 2 weeks. May ride in the car locally now. May begin to drive locally in 2 weeks.   Other Restrictions   Complete by:  As directed     Walk gradually increasing distances out in the fresh air at least twice a day. Walking additional 6 times inside the house, gradually increasing distances, daily. No bending, lifting, or twisting. Perform activities between shoulder and waist height (that is at counter height when standing or table height when sitting).     Allergies as of 06/22/2017   No Known Allergies     Medication List    TAKE these medications   BIOFREEZE EX Apply 1 application topically daily as needed (muscle aches).   bismuth subsalicylate 841 LK/44WN suspension Commonly known as:  PEPTO BISMOL Take 30 mLs by mouth every 6 (six) hours as needed for indigestion.   diphenhydramine-acetaminophen 25-500 MG Tabs tablet Commonly known as:  TYLENOL PM Take 1 tablet by mouth at bedtime as needed (sleep).   guaiFENesin 600 MG 12 hr tablet Commonly known as:  MUCINEX Take 600 mg by mouth daily as needed for cough or to loosen phlegm.   HYDROcodone-acetaminophen 5-325 MG tablet Commonly known as:  NORCO/VICODIN Take 1-2 tablets every 4 (four) hours as needed by mouth for moderate pain.   lovastatin 40 MG tablet Commonly known as:  MEVACOR Take 40 mg by mouth daily.   MELATONIN PO Take 1 tablet by mouth at bedtime as needed (sleep).   meloxicam 15 MG tablet Commonly known as:  MOBIC Take 15 mg by mouth daily.   multivitamin with minerals Tabs tablet Take 1 tablet by mouth daily.   OSTEO BI-FLEX REGULAR STRENGTH PO Take 1 tablet by mouth 2 (two) times daily.   pantoprazole 40 MG tablet Commonly known as:  PROTONIX  Take 40 mg by mouth 2 (two) times daily.   simethicone 80 MG chewable tablet Commonly known as:  MYLICON Chew 80 mg 4 (four) times daily as needed by mouth for flatulence.   valACYclovir 1000 MG tablet Commonly known as:  VALTREX Take 2,000 mg by mouth 2 (two) times daily as needed (cold sores). For 2 days            Discharge Care Instructions  (From admission, onward)         Start     Ordered   06/22/17 0000  Discharge wound care:    Comments:  Leave the wound open to air. Shower daily with the wound uncovered. Water and soapy water should run over the incision area. Do not wash directly on the incision for 2 weeks. Remove the glue after 2 weeks.   06/22/17 0806       Signed: Hosie Spangle 06/22/2017, 8:06 AM

## 2017-06-22 NOTE — Discharge Instructions (Signed)

## 2017-06-22 NOTE — Care Management Note (Signed)
Case Management Note  Patient Details  Name: Manuel Harper MRN: [id number] Date of Birth: [birth year]  Subjective/Objective:                 Patient with order to DC to home today. Chart reviewed. No Home Health or Equipment needs, no unacknowledged Case Management consults or medication needs identified at the time of this note. Plan for DC to home. If needs arise today prior to discharge, please call Carles Collet RN CM at [phone]. CM signing off   Action/Plan:   Expected Discharge Date:  06/22/17               Expected Discharge Plan:  Home/Self Care  In-House Referral:     Discharge planning Services  CM Consult  Post Acute Care Choice:    Choice offered to:     DME Arranged:    DME Agency:     HH Arranged:    [place] Agency:     Status of Service:  Completed, signed off  If discussed at [employer] of Stay Meetings, dates discussed:    Additional Comments:  Carles Collet, RN 06/22/2017, 8:33 AM

## 2017-06-22 NOTE — Progress Notes (Signed)
Patient alert and oriented, mae's well, voiding adequate amount of urine, swallowing without difficulty, no c/o pain at time of discharge. Patient discharged home with family. Script and discharged instructions given to patient. Patient and family stated understanding of instructions given. Patient has an appointment with Dr. Nudelman 

## 2017-06-28 DIAGNOSIS — M5416 Radiculopathy, lumbar region: Secondary | ICD-10-CM | POA: Diagnosis not present

## 2017-07-17 DIAGNOSIS — Z85828 Personal history of other malignant neoplasm of skin: Secondary | ICD-10-CM | POA: Diagnosis not present

## 2017-07-17 DIAGNOSIS — M1712 Unilateral primary osteoarthritis, left knee: Secondary | ICD-10-CM | POA: Diagnosis not present

## 2017-07-17 DIAGNOSIS — L821 Other seborrheic keratosis: Secondary | ICD-10-CM | POA: Diagnosis not present

## 2017-07-17 DIAGNOSIS — L570 Actinic keratosis: Secondary | ICD-10-CM | POA: Diagnosis not present

## 2017-09-13 DIAGNOSIS — Z981 Arthrodesis status: Secondary | ICD-10-CM | POA: Diagnosis not present

## 2017-09-14 DIAGNOSIS — J069 Acute upper respiratory infection, unspecified: Secondary | ICD-10-CM | POA: Diagnosis not present

## 2017-09-14 DIAGNOSIS — R05 Cough: Secondary | ICD-10-CM | POA: Diagnosis not present

## 2017-11-29 DIAGNOSIS — L570 Actinic keratosis: Secondary | ICD-10-CM | POA: Diagnosis not present

## 2017-11-29 DIAGNOSIS — D485 Neoplasm of uncertain behavior of skin: Secondary | ICD-10-CM | POA: Diagnosis not present

## 2017-11-29 DIAGNOSIS — Z85828 Personal history of other malignant neoplasm of skin: Secondary | ICD-10-CM | POA: Diagnosis not present

## 2017-11-29 DIAGNOSIS — D225 Melanocytic nevi of trunk: Secondary | ICD-10-CM | POA: Diagnosis not present

## 2017-12-25 DIAGNOSIS — R030 Elevated blood-pressure reading, without diagnosis of hypertension: Secondary | ICD-10-CM | POA: Diagnosis not present

## 2017-12-25 DIAGNOSIS — Z981 Arthrodesis status: Secondary | ICD-10-CM | POA: Diagnosis not present

## 2017-12-29 DIAGNOSIS — M1711 Unilateral primary osteoarthritis, right knee: Secondary | ICD-10-CM | POA: Diagnosis not present

## 2017-12-29 DIAGNOSIS — M1712 Unilateral primary osteoarthritis, left knee: Secondary | ICD-10-CM | POA: Diagnosis not present

## 2018-02-27 ENCOUNTER — Other Ambulatory Visit: Payer: Self-pay | Admitting: Gastroenterology

## 2018-02-27 DIAGNOSIS — R110 Nausea: Secondary | ICD-10-CM | POA: Diagnosis not present

## 2018-02-27 DIAGNOSIS — Z86010 Personal history of colonic polyps: Secondary | ICD-10-CM | POA: Diagnosis not present

## 2018-02-27 DIAGNOSIS — K219 Gastro-esophageal reflux disease without esophagitis: Secondary | ICD-10-CM | POA: Diagnosis not present

## 2018-03-06 ENCOUNTER — Other Ambulatory Visit: Payer: 59

## 2018-03-13 ENCOUNTER — Encounter (HOSPITAL_COMMUNITY): Payer: Self-pay

## 2018-03-13 ENCOUNTER — Emergency Department (HOSPITAL_COMMUNITY)
Admission: EM | Admit: 2018-03-13 | Discharge: 2018-03-13 | Disposition: A | Discharge status: Home / Self Care | Payer: 59 | Attending: Emergency Medicine | Admitting: Emergency Medicine

## 2018-03-13 DIAGNOSIS — F17290 Nicotine dependence, other tobacco product, uncomplicated: Secondary | ICD-10-CM | POA: Insufficient documentation

## 2018-03-13 DIAGNOSIS — Z79899 Other long term (current) drug therapy: Secondary | ICD-10-CM | POA: Insufficient documentation

## 2018-03-13 DIAGNOSIS — R040 Epistaxis: Secondary | ICD-10-CM | POA: Diagnosis not present

## 2018-03-13 LAB — CBC
HCT: 31.9 % — ABNORMAL LOW (ref 39.0–52.0)
HEMOGLOBIN: 10.7 g/dL — AB (ref 13.0–17.0)
MCH: 31.2 pg (ref 26.0–34.0)
MCHC: 33.5 g/dL (ref 30.0–36.0)
MCV: 93 fL (ref 78.0–100.0)
PLATELETS: 201 10*3/uL (ref 150–400)
RBC: 3.43 MIL/uL — AB (ref 4.22–5.81)
RDW: 13.4 % (ref 11.5–15.5)
WBC: 6.7 10*3/uL (ref 4.0–10.5)

## 2018-03-13 MED ORDER — OXYMETAZOLINE HCL 0.05 % NA SOLN
1.0000 | Freq: Once | NASAL | Status: AC
  Administered 2018-03-13: 1 via NASAL
  Filled 2018-03-13: qty 15

## 2018-03-13 MED ORDER — OXYCODONE-ACETAMINOPHEN 5-325 MG PO TABS
1.0000 | ORAL_TABLET | Freq: Once | ORAL | Status: AC
  Administered 2018-03-13: 1 via ORAL
  Filled 2018-03-13: qty 1

## 2018-03-13 MED ORDER — CEPHALEXIN 500 MG PO CAPS: 500.0000 mg | ORAL_CAPSULE | Freq: Four times a day (QID) | ORAL | 0 refills | Status: DC

## 2018-03-13 MED ORDER — TRANEXAMIC ACID 1000 MG/10ML IV SOLN
500.0000 mg | Freq: Once | INTRAVENOUS | Status: DC
  Filled 2018-03-13: qty 10

## 2018-03-13 MED ORDER — LIDOCAINE HCL URETHRAL/MUCOSAL 2 % EX GEL
CUTANEOUS | Status: AC
  Administered 2018-03-13: 07:00:00
  Filled 2018-03-13: qty 20

## 2018-03-13 MED ORDER — HYDROCODONE-ACETAMINOPHEN 5-325 MG PO TABS: 1.0000 | ORAL_TABLET | Freq: Four times a day (QID) | ORAL | 0 refills | Status: DC | PRN

## 2018-03-13 MED ORDER — ONDANSETRON 4 MG PO TBDP
4.0000 mg | ORAL_TABLET | Freq: Once | ORAL | Status: AC
  Administered 2018-03-13: 4 mg via ORAL
  Filled 2018-03-13: qty 1

## 2018-03-13 NOTE — ED Triage Notes (Signed)
Pt states that he has been having several nose bleeds over the past few days, woke up this morning and started bleeding again, not on blood thinners.

## 2018-03-13 NOTE — ED Notes (Signed)
Pt placed in room, nose bleeding, holding several towels to his face. Provider bedside.

## 2018-03-13 NOTE — ED Notes (Signed)
ED Provider at bedside. 

## 2018-03-13 NOTE — ED Provider Notes (Signed)
[facility] Provider Note   CSN: [id number] Arrival date & time: 03/13/18  0555     History   Chief Complaint Chief Complaint  Patient presents with  . Epistaxis    HPI KHAYRI KARGBO is a 62 y.o. male.  HPI  This is a 62 year old male with a history of hyperlipidemia who presents with nosebleed.  Patient reports multiple nosebleeds over the last several days.  He initially had one on Friday while he was in the shower.  It self resolved.  He subsequently had one Saturday as well as Sunday morning.  These were self-limited and resolved on their own.  He denies any trauma, picking.  History of nosebleeds.  He is not on anticoagulants.  Has not noted any other significant bleeding or bruising.  He reports onset of nosebleeds mostly out of the right nare.  This morning it recurred approximately 1 hour ago and has not stopped.  He reports gushing blood and blood going down his throat.  Past Medical History:  Diagnosis Date  . Arthritis   . GERD (gastroesophageal reflux disease)   . Hx of cold sores   . Hyperlipidemia     Patient Active Problem List   Diagnosis Date Noted  . 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 ANTERIOR CRUCIATE LIGAMENT REPAIR Bilateral   . KNEE ARTHROSCOPY Bilateral    over yrs  . LUMBAR LAMINECTOMY/DECOMPRESSION MICRODISCECTOMY Bilateral 09/08/2014   Procedure: LUMBAR LAMINECTOMY/DECOMPRESSION MICRODISCECTOMY 1 LEVEL;  Surgeon: Hosie Spangle, MD;  Location: [facility];  Service: Neurosurgery;  Laterality: Bilateral;  bilateral L45 lumbar laminotomy and foraminotomy        Home Medications    Prior to Admission medications   Medication Sig Start Date End Date Taking? Authorizing Provider  azelastine (ASTELIN) 0.1 % nasal spray Place into both nostrils 2 (two) times daily. Use in each nostril as directed   Yes [provider]    bismuth subsalicylate (PEPTO BISMOL) 262 MG/15ML suspension Take 30 mLs by mouth every 6 (six) hours as needed for indigestion.   Yes [provider]  co-enzyme Q-10 30 MG capsule Take 30 mg by mouth 3 (three) times daily.   Yes [provider]  diphenhydramine-acetaminophen (TYLENOL PM) 25-500 MG TABS tablet Take 1 tablet by mouth at bedtime as needed (sleep).    Yes [provider]  guaiFENesin (MUCINEX) 600 MG 12 hr tablet Take 600 mg by mouth daily as needed for cough or to loosen phlegm.   Yes [provider]  lovastatin (MEVACOR) 40 MG tablet Take 40 mg by mouth daily.  06/22/14  Yes [provider]  MELATONIN PO Take 1 tablet by mouth at bedtime as needed (sleep).   Yes [provider]  meloxicam (MOBIC) 15 MG tablet Take 7.5 mg by mouth daily as needed for pain.  06/25/14  Yes [provider]  Menthol, Topical Analgesic, (BIOFREEZE EX) Apply 1 application topically daily as needed (muscle aches).   Yes [provider]  Misc Natural Products (OSTEO BI-FLEX/5-LOXIN ADVANCED PO) Take 30 mLs by mouth daily.   Yes [provider]  Multiple Vitamin (MULTIVITAMIN WITH MINERALS) TABS tablet Take 1 tablet by mouth daily.   Yes [provider]  OVER THE COUNTER MEDICATION Take 1 drop by mouth daily. CBD oil   Yes [provider]  pantoprazole (PROTONIX) 40 MG tablet Take 40 mg by mouth  daily.    Yes [provider]  simethicone (MYLICON) 80 MG chewable tablet Chew 80 mg 4 (four) times daily as needed by mouth for flatulence.   Yes [provider]  Turmeric 500 MG TABS Take 500 mg by mouth.   Yes [provider]  valACYclovir (VALTREX) 1000 MG tablet Take 2,000 mg by mouth 2 (two) times daily as needed (cold sores). For 2 days   Yes [provider]  HYDROcodone-acetaminophen (NORCO/VICODIN) 5-325 MG tablet Take 1-2 tablets every 4 (four) hours as needed by mouth for  moderate pain. Patient not taking: Reported on 03/13/2018 06/22/17   Jovita Gamma, MD    Family History Family History  Problem Relation Age of Onset  . Depression Mother   . Lung cancer Father   . Alcoholism Brother     Social History Social History   Tobacco Use  . Smoking status: Current Some Day Smoker    Types: Cigars  . Smokeless tobacco: Never Used  . Tobacco comment: once a year  Substance Use Topics  . Alcohol use: Yes    Alcohol/week: 6.0 oz    Types: 5 Glasses of wine, 5 Cans of beer per week    Comment: 5-6 per week  . Drug use: No    Comment: occ     Allergies   Patient has no known allergies.   Review of Systems Review of Systems  Constitutional: Negative for fever.  HENT: Positive for nosebleeds.   Respiratory: Negative for shortness of breath.   Cardiovascular: Negative for chest pain.  All other systems reviewed and are negative.    Physical Exam Updated Vital Signs BP 116/85   Pulse 82   Temp 97.6 F (36.4 C) (Oral)   Resp 18   Ht 6' (1.829 m)   Wt 81.6 kg (180 lb)   SpO2 98%   BMI 24.41 kg/m   Physical Exam  Constitutional: He is oriented to person, place, and time. He appears well-developed and well-nourished.  HENT:  Head: Normocephalic and atraumatic.  Active bleeding from his right nare, significant evacuation of clot with blowing, unable to visualize anterior mucous membranes as patient is briskly bleeding.  No improvement with Afrin after blowing out clots.  Eyes: Pupils are equal, round, and reactive to light.  Neck: Neck supple.  Cardiovascular: Normal rate and regular rhythm.  Pulmonary/Chest: Effort normal. No respiratory distress.  Musculoskeletal: He exhibits no edema.  Neurological: He is alert and oriented to person, place, and time.  Skin: Skin is warm and dry.  Psychiatric: He has a normal mood and affect.  Nursing note and vitals reviewed.    ED Treatments / Results  Labs (all labs ordered are listed,  but only abnormal results are displayed) Labs Reviewed  CBC    EKG None  Radiology No results found.  Procedures .Epistaxis Management Date/Time: 03/13/2018 7:19 AM Performed by: Merryl Hacker, MD Authorized by: Merryl Hacker, MD   Consent:    Consent obtained:  Verbal   Consent given by:  Patient   Risks discussed:  Bleeding, infection, nasal injury and pain   Alternatives discussed:  No treatment Anesthesia (see MAR for exact dosages):    Anesthesia method:  Topical application   Topical anesthetic:  Lidocaine gel Procedure details:    Treatment site:  R anterior   Treatment method:  Nasal balloon   Treatment complexity:  Extensive   Treatment episode: initial   Post-procedure details:    Assessment:  No  improvement   Patient tolerance of procedure:  Tolerated well, no immediate complications Comments:     7.5 Rhino Rocket placed with approximately 12 cc air   (including critical care time)  Medications Ordered in ED Medications  tranexamic acid (CYKLOKAPRON) injection 500 mg (has no administration in time range)  oxymetazoline (AFRIN) 0.05 % nasal spray 1 spray (1 spray Each Nare Given 03/13/18 0610)  lidocaine (XYLOCAINE) 2 % jelly (  Given 03/13/18 0630)  oxyCODONE-acetaminophen (PERCOCET/ROXICET) 5-325 MG per tablet 1 tablet (1 tablet Oral Given 03/13/18 0653)  ondansetron (ZOFRAN-ODT) disintegrating tablet 4 mg (4 mg Oral Given 03/13/18 0645)     Initial Impression / Assessment and Plan / ED Course  I have reviewed the triage vital signs and the nursing notes.  Pertinent labs & imaging results that were available during my care of the patient were reviewed by me and considered in my medical decision making (see chart for details).  Clinical Course as of Mar 13 716  Tue Mar 13, 2018  0716 Recheck, epistaxis has resolved.  No active bleeding at this time.  Awaiting CBC.   [CH]    Clinical Course User Index [CH] Lucyle Alumbaugh, Barbette Hair, MD    Patient  presents with right nosebleed.  Unable to visualize source of bleeding as patient is briskly bleeding.  Measures to temporize have not been successful.  Placed a [employer] with good tamponade of bleeding.  On multiple rechecks, patient is not rebleeding.  Will check CBC for hemoglobin and platelets.  No other risk factors for bleeding.  If negative and no longer bleeding, will discharge home with ENT follow-up in 3 to 5 days.  Will place on antibiotics.  After history, exam, and medical workup I feel the patient has been appropriately medically screened and is safe for discharge home. Pertinent diagnoses were discussed with the patient. Patient was given return precautions.  Final Clinical Impressions(s) / ED Diagnoses   Final diagnoses:  Epistaxis    ED Discharge Orders    None       Merryl Hacker, MD 03/13/18 [phone]

## 2018-03-13 NOTE — Discharge Instructions (Signed)
You were seen today for nosebleed.  You have packing in place.  Keep in place for the next 3 to 5 days.  Follow-up with ENT for definitive management.  If you rebleed, you may need to be reevaluated.  If during daytime hours, you can call ENT office and they may be able to see you.  You will be placed on antibiotics for prophylaxis.

## 2018-03-14 ENCOUNTER — Other Ambulatory Visit: Payer: Self-pay

## 2018-03-19 DIAGNOSIS — R040 Epistaxis: Secondary | ICD-10-CM | POA: Diagnosis not present

## 2018-03-21 ENCOUNTER — Other Ambulatory Visit: Payer: Self-pay

## 2018-03-28 DIAGNOSIS — R040 Epistaxis: Secondary | ICD-10-CM | POA: Diagnosis not present

## 2018-04-09 DIAGNOSIS — R040 Epistaxis: Secondary | ICD-10-CM | POA: Diagnosis not present

## 2018-04-10 DIAGNOSIS — Z0000 Encounter for general adult medical examination without abnormal findings: Secondary | ICD-10-CM | POA: Diagnosis not present

## 2018-04-10 DIAGNOSIS — E7800 Pure hypercholesterolemia, unspecified: Secondary | ICD-10-CM | POA: Diagnosis not present

## 2018-04-10 DIAGNOSIS — Z79899 Other long term (current) drug therapy: Secondary | ICD-10-CM | POA: Diagnosis not present

## 2018-04-10 DIAGNOSIS — Z125 Encounter for screening for malignant neoplasm of prostate: Secondary | ICD-10-CM | POA: Diagnosis not present

## 2018-04-19 DIAGNOSIS — J310 Chronic rhinitis: Secondary | ICD-10-CM | POA: Diagnosis not present

## 2018-04-19 DIAGNOSIS — Z7289 Other problems related to lifestyle: Secondary | ICD-10-CM | POA: Diagnosis not present

## 2018-04-19 DIAGNOSIS — R040 Epistaxis: Secondary | ICD-10-CM | POA: Diagnosis not present

## 2018-04-19 DIAGNOSIS — Z1383 Encounter for screening for respiratory disorder NEC: Secondary | ICD-10-CM | POA: Diagnosis not present

## 2018-04-19 DIAGNOSIS — D140 Benign neoplasm of middle ear, nasal cavity and accessory sinuses: Secondary | ICD-10-CM | POA: Diagnosis not present

## 2018-04-25 DIAGNOSIS — R7301 Impaired fasting glucose: Secondary | ICD-10-CM | POA: Diagnosis not present

## 2018-05-02 DIAGNOSIS — J310 Chronic rhinitis: Secondary | ICD-10-CM | POA: Diagnosis not present

## 2018-05-02 DIAGNOSIS — Z7289 Other problems related to lifestyle: Secondary | ICD-10-CM | POA: Diagnosis not present

## 2018-05-14 DIAGNOSIS — J310 Chronic rhinitis: Secondary | ICD-10-CM | POA: Diagnosis not present

## 2018-05-30 ENCOUNTER — Ambulatory Visit
Admission: RE | Admit: 2018-05-30 | Discharge: 2018-05-30 | Disposition: A | Discharge status: Home / Self Care | Payer: 59 | Source: Ambulatory Visit | Attending: Family Medicine | Admitting: Family Medicine

## 2018-05-30 ENCOUNTER — Other Ambulatory Visit: Payer: Self-pay | Admitting: Family Medicine

## 2018-05-30 DIAGNOSIS — R05 Cough: Secondary | ICD-10-CM

## 2018-05-30 DIAGNOSIS — J349 Unspecified disorder of nose and nasal sinuses: Secondary | ICD-10-CM | POA: Diagnosis not present

## 2018-05-30 DIAGNOSIS — R059 Cough, unspecified: Secondary | ICD-10-CM

## 2018-06-12 DIAGNOSIS — Z7289 Other problems related to lifestyle: Secondary | ICD-10-CM | POA: Diagnosis not present

## 2018-06-12 DIAGNOSIS — J310 Chronic rhinitis: Secondary | ICD-10-CM | POA: Diagnosis not present

## 2018-06-26 DIAGNOSIS — M5136 Other intervertebral disc degeneration, lumbar region: Secondary | ICD-10-CM | POA: Diagnosis not present

## 2018-06-26 DIAGNOSIS — M4726 Other spondylosis with radiculopathy, lumbar region: Secondary | ICD-10-CM | POA: Diagnosis not present

## 2018-06-26 DIAGNOSIS — Z981 Arthrodesis status: Secondary | ICD-10-CM | POA: Diagnosis not present

## 2018-07-03 DIAGNOSIS — D649 Anemia, unspecified: Secondary | ICD-10-CM | POA: Diagnosis not present

## 2018-07-22 IMAGING — CR DG LUMBAR SPINE 2-3V
2 series · 2 of 2 positions shown · non-contrast
Comparison: Lumbar MRI April 27, 2017

CLINICAL DATA: Localization for posterior fusion

EXAM:
LUMBAR SPINE - 2-3 VIEW

[xtable lateral (1 of 2)]
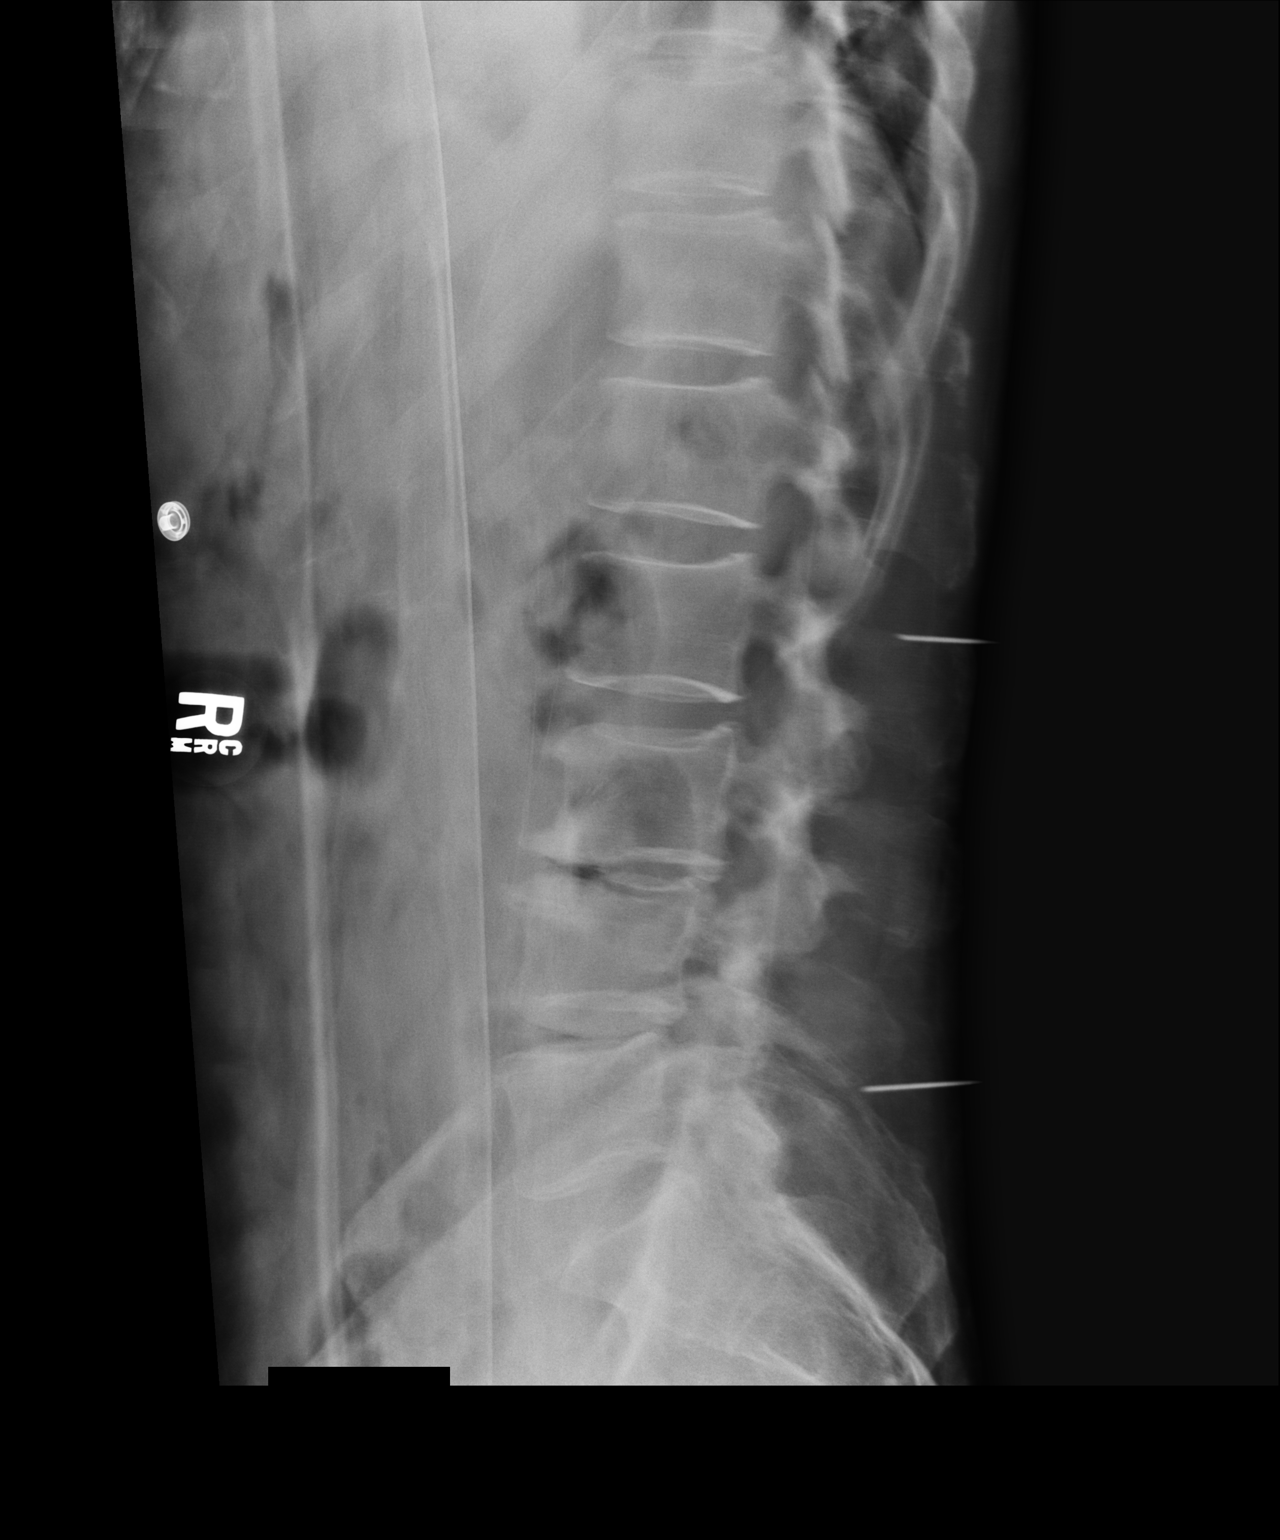

[xtable lateral (2 of 2)]
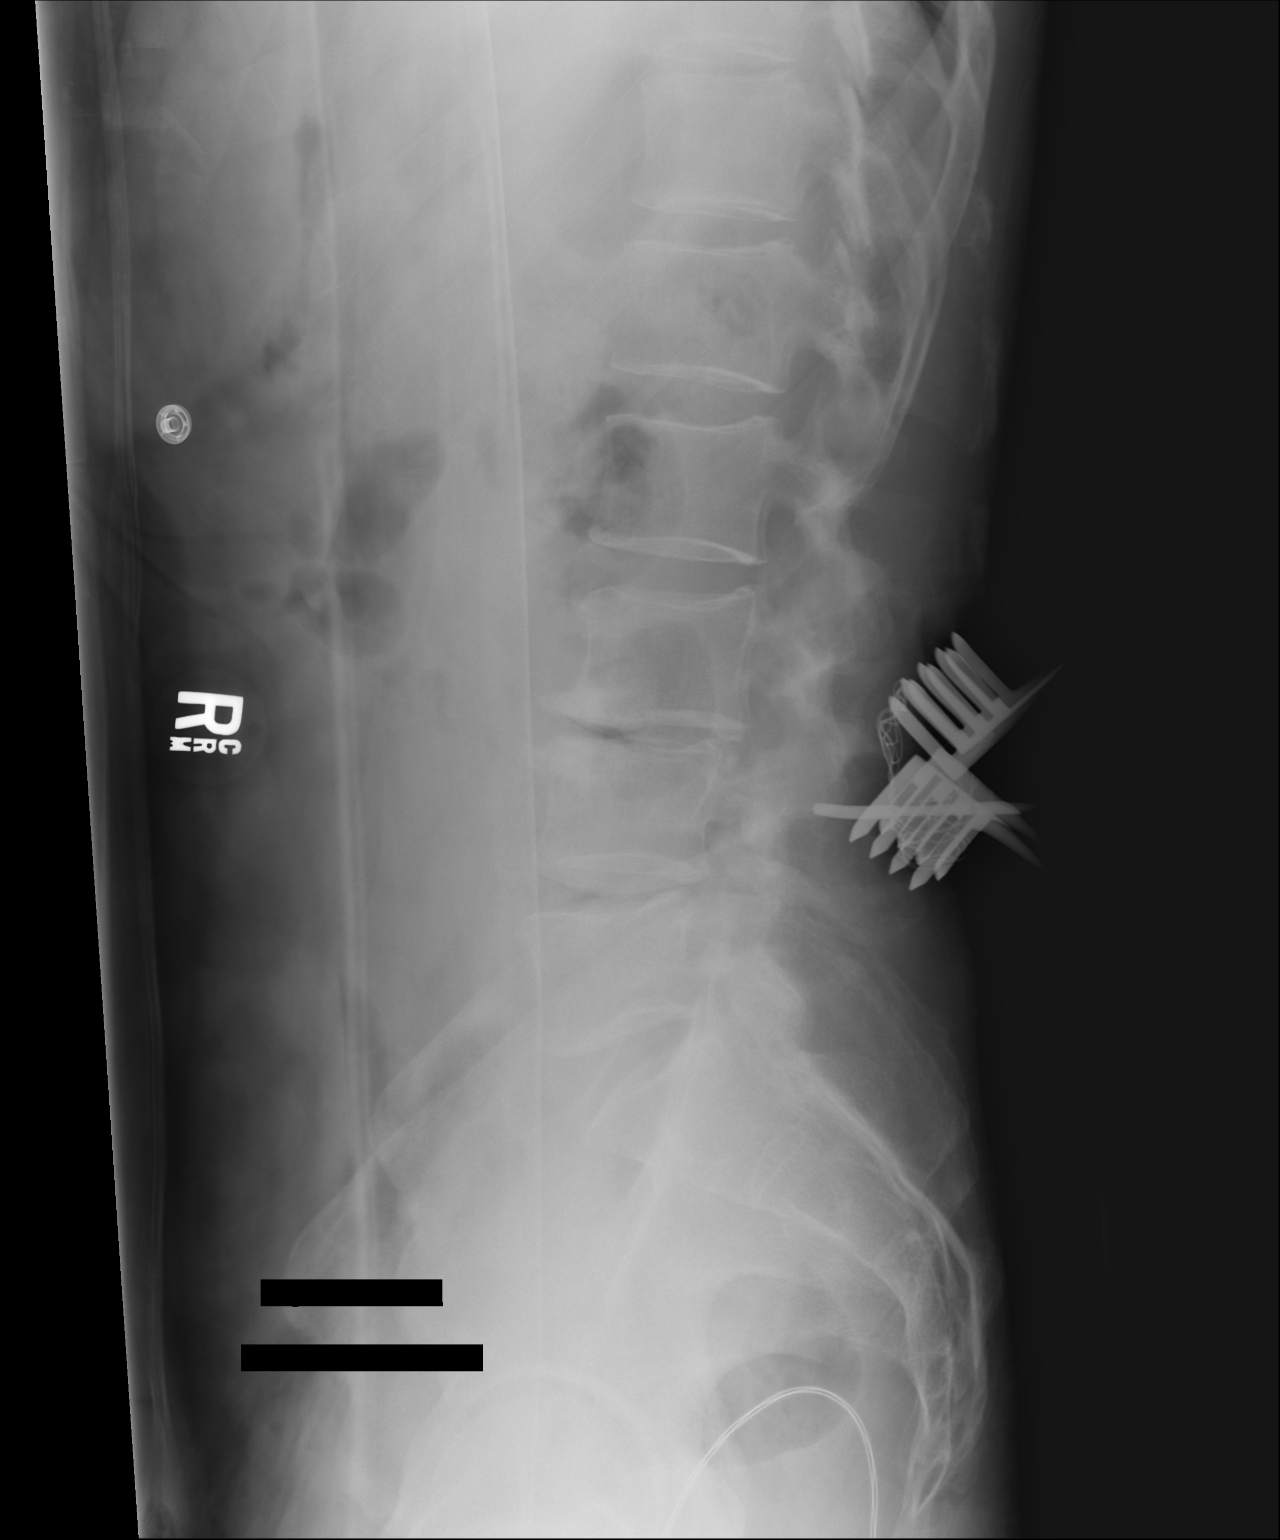

[2 of 2 positions shown; findings below may reference images not displayed]

FINDINGS: Cross-table lateral lumbar image time stamped [DATE] submitted.
Metallic probe tips are posterior to the midportion of the L2
vertebral body and posterior to the midportion of the L5 vertebral
body respectively. There is moderately severe disc space narrowing
at L3-4 with moderate disc space narrowing at L4-5. No fracture or
spondylolisthesis.

Cross-table lateral lumbar image time stamped [DATE] submitted.
Metallic probe tip is posterior midportion of the L4 vertebral body.
There is disc space narrowing at L3-4 and L4-5. No fracture.
IMPRESSION: One final submitted cross-table lateral lumbar image, metallic probe
tip is posterior to the midportion of the L4 vertebral body. Disc
space narrowing present at L3-4 and L4-5.

## 2018-07-22 IMAGING — RF DG LUMBAR SPINE 2-3V
1 series · 2 of 2 positions shown · non-contrast
Comparison: Intraoperative cross-table lateral plain film from
earlier same day.

CLINICAL DATA: L3-4/4-5 PLIF Radiation Safety Timeout performed by
IMOBIL

EXAM:
DG C-ARM 61-120 MIN; LUMBAR SPINE - 2-3 VIEW

[Series 1: run · 2 of 2 slices shown]
[im 1/2]
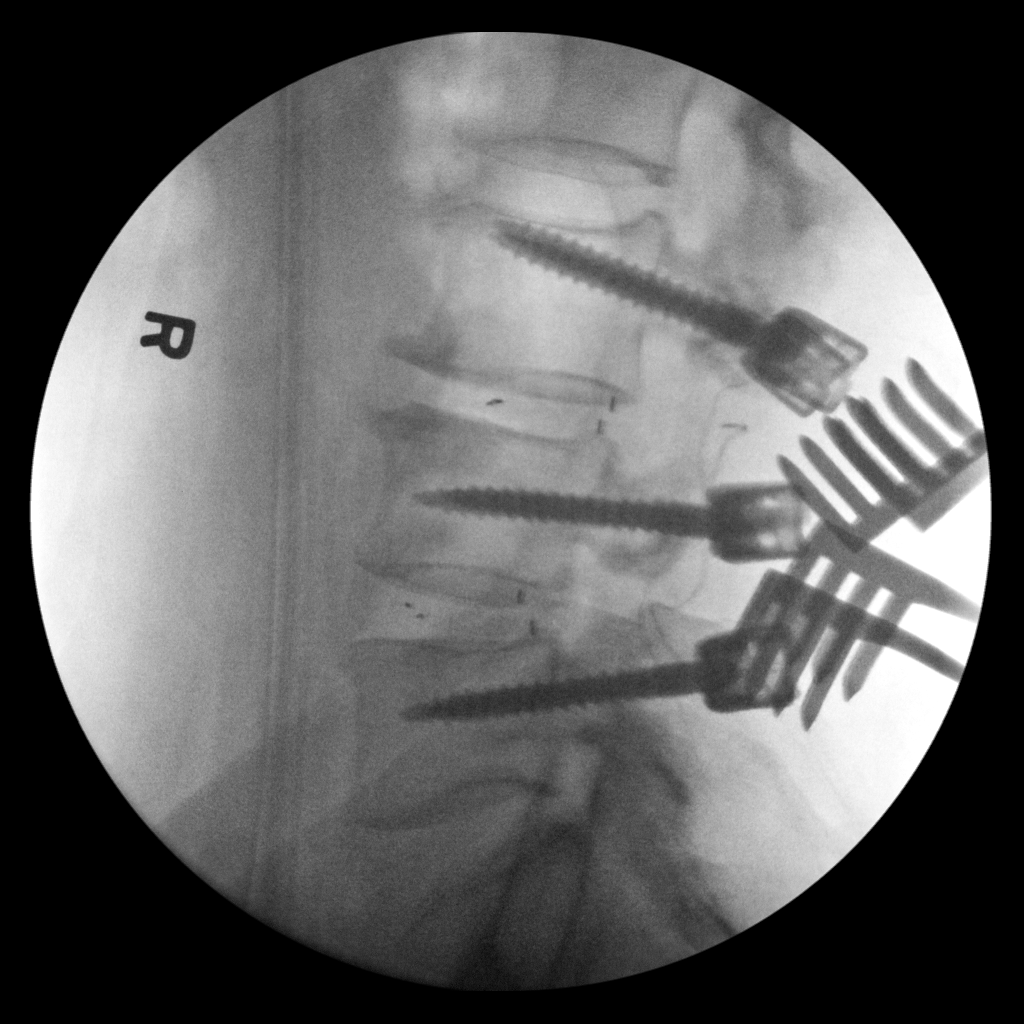
[im 2/2]
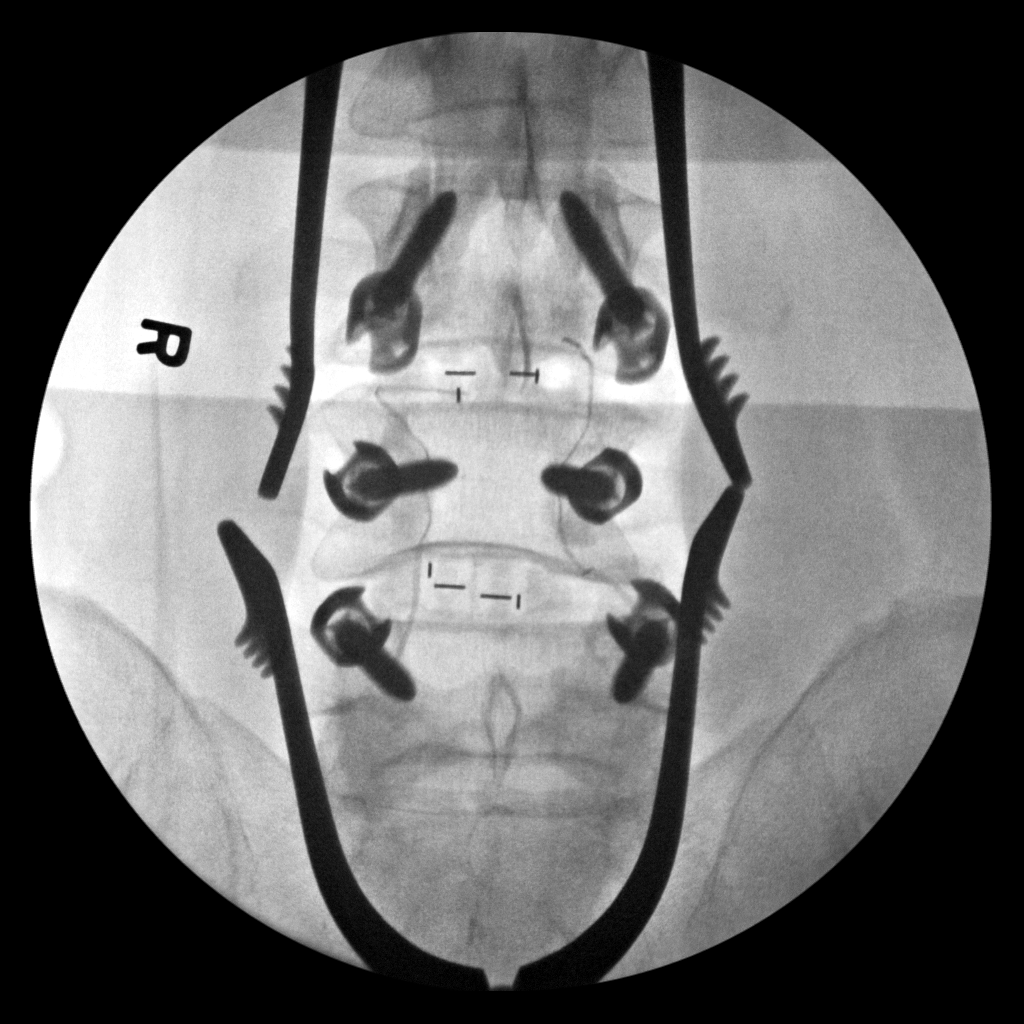

[2 of 2 positions shown; findings below may reference images not displayed]

FINDINGS: AP and lateral intraoperative fluoroscopic spot images are provided
showing interval posterior interpedicular screw placement at the L3
through L5 levels. Hardware appears appropriately positioned.
Intervening disc spacers/ cages in place. Osseous alignment appears
stable.

Fluoroscopy provided for 1 minutes 31 seconds.
IMPRESSION: Intraoperative fluoroscopic spot images showing placement of
posterior interpedicular screws at the L3 through L5 levels.
Hardware appears appropriately positioned. No evidence of surgical
complicating feature.

## 2018-07-30 DIAGNOSIS — M1712 Unilateral primary osteoarthritis, left knee: Secondary | ICD-10-CM | POA: Diagnosis not present

## 2018-07-30 DIAGNOSIS — M1711 Unilateral primary osteoarthritis, right knee: Secondary | ICD-10-CM | POA: Diagnosis not present

## 2018-08-03 DIAGNOSIS — M179 Osteoarthritis of knee, unspecified: Secondary | ICD-10-CM | POA: Diagnosis not present

## 2018-08-03 DIAGNOSIS — R220 Localized swelling, mass and lump, head: Secondary | ICD-10-CM | POA: Diagnosis not present

## 2018-08-03 DIAGNOSIS — Z01810 Encounter for preprocedural cardiovascular examination: Secondary | ICD-10-CM | POA: Diagnosis not present

## 2018-08-21 ENCOUNTER — Ambulatory Visit: Payer: Self-pay | Admitting: Orthopedic Surgery

## 2018-08-30 NOTE — Patient Instructions (Addendum)
Manuel Harper  08/30/2018   Your procedure is scheduled on: 09-14-18    Report to [facility] Main  Entrance   Report to admitting at          0530  AM    Call this number if you have problems the morning of surgery [phone]    Remember: Do not eat food or drink liquids :After Midnight.   BRUSH YOUR TEETH MORNING OF SURGERY AND RINSE YOUR MOUTH OUT, NO CHEWING GUM CANDY OR MINTS.     Take these medicines the morning of surgery with A SIP OF WATER: protonix, lovastatin                                You may not have any metal on your body including hair pins and              piercings  Do not wear jewelry,  lotions, powders or perfumes, deodorant                     Men may shave face and neck.   Do not bring valuables to the hospital. [place].  Contacts, dentures or bridgework may not be worn into surgery.  Leave suitcase in the car. After surgery it may be brought to your room.               Please read over the following fact sheets you were given: _____________________________________________________________________           [facility] - Preparing for Surgery Before surgery, you can play an important role.  Because skin is not sterile, your skin needs to be as free of germs as possible.  You can reduce the number of germs on your skin by washing with CHG (chlorahexidine gluconate) soap before surgery.  CHG is an antiseptic cleaner which kills germs and bonds with the skin to continue killing germs even after washing. Please DO NOT use if you have an allergy to CHG or antibacterial soaps.  If your skin becomes reddened/irritated stop using the CHG and inform your nurse when you arrive at Short Stay. Do not shave (including legs and underarms) for at least 48 hours prior to the first CHG shower.  You may shave your face/neck. Please follow these instructions carefully:  1.  Shower with CHG Soap  the night before surgery and the  morning of Surgery.  2.  If you choose to wash your hair, wash your hair first as usual with your  normal  shampoo.  3.  After you shampoo, rinse your hair and body thoroughly to remove the  shampoo.                           4.  Use CHG as you would any other liquid soap.  You can apply chg directly  to the skin and wash                       Gently with a scrungie or clean washcloth.  5.  Apply the CHG Soap to your body ONLY FROM THE NECK DOWN.   Do not use on face/ open  Wound or open sores. Avoid contact with eyes, ears mouth and genitals (private parts).                       Wash face,  Genitals (private parts) with your normal soap.             6.  Wash thoroughly, paying special attention to the area where your surgery  will be performed.  7.  Thoroughly rinse your body with warm water from the neck down.  8.  DO NOT shower/wash with your normal soap after using and rinsing off  the CHG Soap.                9.  Pat yourself dry with a clean towel.            10.  Wear clean pajamas.            11.  Place clean sheets on your bed the night of your first shower and do not  sleep with pets. Day of Surgery : Do not apply any lotions/deodorants the morning of surgery.  Please wear clean clothes to the hospital/surgery center.  FAILURE TO FOLLOW THESE INSTRUCTIONS MAY RESULT IN THE CANCELLATION OF YOUR SURGERY PATIENT SIGNATURE_________________________________  NURSE SIGNATURE__________________________________  ________________________________________________________________________  WHAT IS A BLOOD TRANSFUSION? Blood Transfusion Information  A transfusion is the replacement of blood or some of its parts. Blood is made up of multiple cells which provide different functions.  Red blood cells carry oxygen and are used for blood loss replacement.  White blood cells fight against infection.  Platelets control bleeding.  Plasma  helps clot blood.  Other blood products are available for specialized needs, such as hemophilia or other clotting disorders. BEFORE THE TRANSFUSION  Who gives blood for transfusions?   Healthy volunteers who are fully evaluated to make sure their blood is safe. This is blood bank blood. Transfusion therapy is the safest it has ever been in the practice of medicine. Before blood is taken from a donor, a complete history is taken to make sure that person has no history of diseases nor engages in risky social behavior (examples are intravenous drug use or sexual activity with multiple partners). The donor's travel history is screened to minimize risk of transmitting infections, such as malaria. The donated blood is tested for signs of infectious diseases, such as HIV and hepatitis. The blood is then tested to be sure it is compatible with you in order to minimize the chance of a transfusion reaction. If you or a relative donates blood, this is often done in anticipation of surgery and is not appropriate for emergency situations. It takes many days to process the donated blood. RISKS AND COMPLICATIONS Although transfusion therapy is very safe and saves many lives, the main dangers of transfusion include:   Getting an infectious disease.  Developing a transfusion reaction. This is an allergic reaction to something in the blood you were given. Every precaution is taken to prevent this. The decision to have a blood transfusion has been considered carefully by your caregiver before blood is given. Blood is not given unless the benefits outweigh the risks. AFTER THE TRANSFUSION  Right after receiving a blood transfusion, you will usually feel much better and more energetic. This is especially true if your red blood cells have gotten low (anemic). The transfusion raises the level of the red blood cells which carry oxygen, and this usually causes an energy increase.  The  nurse administering the transfusion  will monitor you carefully for complications. HOME CARE INSTRUCTIONS  No special instructions are needed after a transfusion. You may find your energy is better. Speak with your caregiver about any limitations on activity for underlying diseases you may have. SEEK MEDICAL CARE IF:   Your condition is not improving after your transfusion.  You develop redness or irritation at the intravenous (IV) site. SEEK IMMEDIATE MEDICAL CARE IF:  Any of the following symptoms occur over the next 12 hours:  Shaking chills.  You have a temperature by mouth above 102 F (38.9 C), not controlled by medicine.  Chest, back, or muscle pain.  People around you feel you are not acting correctly or are confused.  Shortness of breath or difficulty breathing.  Dizziness and fainting.  You get a rash or develop hives.  You have a decrease in urine output.  Your urine turns a dark color or changes to pink, red, or brown. Any of the following symptoms occur over the next 10 days:  You have a temperature by mouth above 102 F (38.9 C), not controlled by medicine.  Shortness of breath.  Weakness after normal activity.  The white part of the eye turns yellow (jaundice).  You have a decrease in the amount of urine or are urinating less often.  Your urine turns a dark color or changes to pink, red, or brown. Document Released: 07/29/2000 Document Revised: 10/24/2011 Document Reviewed: 03/17/2008 Exit[employer] Patient Information 2014 [place].  _______________________________________________________________________  Incentive Spirometer  An incentive spirometer is a tool that can help keep your lungs clear and active. This tool measures how well you are filling your lungs with each breath. Taking long deep breaths may help reverse or decrease the chance of developing breathing (pulmonary) problems (especially infection) following:  A long period of time when you are unable to move or be  active. BEFORE THE PROCEDURE   If the spirometer includes an indicator to show your best effort, your nurse or respiratory therapist will set it to a desired goal.  If possible, sit up straight or lean slightly forward. Try not to slouch.  Hold the incentive spirometer in an upright position. INSTRUCTIONS FOR USE  1. Sit on the edge of your bed if possible, or sit up as far as you can in bed or on a chair. 2. Hold the incentive spirometer in an upright position. 3. Breathe out normally. 4. Place the mouthpiece in your mouth and seal your lips tightly around it. 5. Breathe in slowly and as deeply as possible, raising the piston or the ball toward the top of the column. 6. Hold your breath for 3-5 seconds or for as long as possible. Allow the piston or ball to fall to the bottom of the column. 7. Remove the mouthpiece from your mouth and breathe out normally. 8. Rest for a few seconds and repeat Steps 1 through 7 at least 10 times every 1-2 hours when you are awake. Take your time and take a few normal breaths between deep breaths. 9. The spirometer may include an indicator to show your best effort. Use the indicator as a goal to work toward during each repetition. 10. After each set of 10 deep breaths, practice coughing to be sure your lungs are clear. If you have an incision (the cut made at the time of surgery), support your incision when coughing by placing a pillow or rolled up towels firmly against it. Once you are able to get  out of bed, walk around indoors and cough well. You may stop using the incentive spirometer when instructed by your caregiver.  RISKS AND COMPLICATIONS  Take your time so you do not get dizzy or light-headed.  If you are in pain, you may need to take or ask for pain medication before doing incentive spirometry. It is harder to take a deep breath if you are having pain. AFTER USE  Rest and breathe slowly and easily.  It can be helpful to keep track of a log of  your progress. Your caregiver can provide you with a simple table to help with this. If you are using the spirometer at home, follow these instructions: [place] IF:   You are having difficultly using the spirometer.  You have trouble using the spirometer as often as instructed.  Your pain medication is not giving enough relief while using the spirometer.  You develop fever of 100.5 F (38.1 C) or higher. SEEK IMMEDIATE MEDICAL CARE IF:   You cough up bloody sputum that had not been present before.  You develop fever of 102 F (38.9 C) or greater.  You develop worsening pain at or near the incision site. MAKE SURE YOU:   Understand these instructions.  Will watch your condition.  Will get help right away if you are not doing well or get worse. Document Released: 12/12/2006 Document Revised: 10/24/2011 Document Reviewed: 02/12/2007 [facility] Patient Information 2014 [place], [place].   ________________________________________________________________________

## 2018-08-30 NOTE — Progress Notes (Addendum)
Clearance Dr. Marisue Humble 08-03-18 on chart  cxr and labs 08-06-18 on chart  EKG 08-03-18 on chart

## 2018-09-03 DIAGNOSIS — M1711 Unilateral primary osteoarthritis, right knee: Secondary | ICD-10-CM | POA: Diagnosis not present

## 2018-09-03 NOTE — H&P (Signed)
TOTAL KNEE ADMISSION H&P  Patient is being admitted for left total knee arthroplasty.  Subjective:  Chief Complaint:left knee pain.  HPI: Manuel Harper, 63 y.o. male, has a history of pain and functional disability in the left knee due to arthritis and has failed non-surgical conservative treatments for greater than 12 weeks to includecorticosteriod injections, viscosupplementation injections, supervised PT with diminished ADL's post treatment and activity modification.  Onset of symptoms was gradual, starting 3 years ago with gradually worsening course since that time. The patient noted prior procedures on the knee to include  ACL reconstruction on the left knee(s).  Patient currently rates pain in the left knee(s) at 8 out of 10 with activity. Patient has worsening of pain with activity and weight bearing, pain that interferes with activities of daily living, pain with passive range of motion and joint swelling.  Patient has evidence of subchondral sclerosis, periarticular osteophytes and joint space narrowing by imaging studies. There is no active infection.  Patient Active Problem List   Diagnosis Date Noted  . Lumbar stenosis with neurogenic claudication 09/08/2014   Past Medical History:  Diagnosis Date  . Arthritis   . GERD (gastroesophageal reflux disease)   . Hx of cold sores   . Hyperlipidemia     Past Surgical History:  Procedure Laterality Date  . ANTERIOR CRUCIATE LIGAMENT REPAIR Bilateral   . KNEE ARTHROSCOPY Bilateral    over yrs  . LUMBAR LAMINECTOMY/DECOMPRESSION MICRODISCECTOMY Bilateral 09/08/2014   Procedure: LUMBAR LAMINECTOMY/DECOMPRESSION MICRODISCECTOMY 1 LEVEL;  Surgeon: Hosie Spangle, MD;  Location: [facility];  Service: Neurosurgery;  Laterality: Bilateral;  bilateral L45 lumbar laminotomy and foraminotomy    No current facility-administered medications for this encounter.    Current Outpatient Medications  Medication Sig Dispense Refill Last Dose  .  cyclobenzaprine (FLEXERIL) 10 MG tablet Take 10 mg by mouth 3 (three) times daily as needed for muscle spasms.     . diphenhydramine-acetaminophen (TYLENOL PM) 25-500 MG TABS tablet Take 1 tablet by mouth at bedtime as needed (sleep).    Past Month at Unknown time  . ibuprofen (ADVIL,MOTRIN) 600 MG tablet Take 600 mg by mouth every 8 (eight) hours as needed for moderate pain.     Marland Kitchen lovastatin (MEVACOR) 40 MG tablet Take 40 mg by mouth daily.   3 03/12/2018 at Unknown time  . MELATONIN PO Take 5 mg by mouth at bedtime.    03/12/2018 at Unknown time  . meloxicam (MOBIC) 15 MG tablet Take 15 mg by mouth daily.   11 03/12/2018 at Unknown time  . Multiple Vitamin (MULTIVITAMIN WITH MINERALS) TABS tablet Take 1 tablet by mouth daily.   unk  . OVER THE COUNTER MEDICATION Take 20 mg by mouth daily. CBD oil  1 dropper = 20mg    03/12/2018 at Unknown time  . pantoprazole (PROTONIX) 40 MG tablet Take 40 mg by mouth daily.    03/12/2018 at Unknown time  . TURMERIC PO Take 1,000 mg by mouth daily.    03/12/2018 at Unknown time  . valACYclovir (VALTREX) 1000 MG tablet Take 1,000 mg by mouth 2 (two) times daily as needed (cold sores). For 2 days    unk  . cephALEXin (KEFLEX) 500 MG capsule Take 1 capsule (500 mg total) by mouth 4 (four) times daily. (Patient not taking: Reported on 08/30/2018) 20 capsule 0 Not Taking at Unknown time  . HYDROcodone-acetaminophen (NORCO/VICODIN) 5-325 MG tablet Take 1 tablet by mouth every 6 (six) hours as needed. (Patient not  taking: Reported on 08/30/2018) 10 tablet 0 Not Taking at Unknown time   No Known Allergies  Social History   Tobacco Use  . Smoking status: Current Some Day Smoker    Types: Cigars  . Smokeless tobacco: Never Used  . Tobacco comment: once a year  Substance Use Topics  . Alcohol use: Yes    Alcohol/week: 10.0 standard drinks    Types: 5 Glasses of wine, 5 Cans of beer per week    Comment: 5-6 per week    Family History  Problem Relation Age of Onset  .  Depression Mother   . Lung cancer Father   . Alcoholism Brother      Review of Systems  Constitutional: Negative.   HENT: Negative.   Eyes: Negative.   Respiratory: Negative.   Cardiovascular: Negative.   Gastrointestinal: Negative.   Genitourinary: Negative.   Musculoskeletal: Positive for joint pain.  Skin: Negative.   Neurological: Negative.   Endo/Heme/Allergies: Negative.   Psychiatric/Behavioral: Negative.     Objective:  Physical Exam  Constitutional: He is oriented to person, place, and time. He appears well-developed.  HENT:  Head: Normocephalic.  Eyes: EOM are normal.  Neck: Normal range of motion.  Cardiovascular: Normal rate and intact distal pulses.  Respiratory: Effort normal.  GI: Soft.  Genitourinary:    Genitourinary Comments: Deferred   Musculoskeletal:     Comments: Left knee edema. Varus stress. Calf soft. Knee stable at varus and valgus stress. Limited ROM  Neurological: He is alert and oriented to person, place, and time.  Skin: Skin is warm and dry.  Psychiatric: His behavior is normal.    Vital signs in last 24 hours: BP: ()/()  Arterial Line BP: ()/()   Labs:   Estimated body mass index is 24.41 kg/m as calculated from the following:   Height as of 03/13/18: 6' (1.829 m).   Weight as of 03/13/18: 81.6 kg.   Imaging Review Plain radiographs demonstrate severe degenerative joint disease of the left knee(s). The overall alignment ismild varus. The bone quality appears to be good for age and reported activity level.   Preoperative templating of the joint replacement has been completed, documented, and submitted to the Operating Room personnel in order to optimize intra-operative equipment management.   Anticipated LOS equal to or greater than 2 midnights due to - Age 52 and older with one or more of the following:  - Obesity  - Expected need for hospital services (PT, OT, Nursing) required for safe  discharge  - Anticipated need for  postoperative skilled nursing care or inpatient rehab  - Active co-morbidities: None OR   - Unanticipated findings during/Post Surgery: None  - Patient is a high risk of re-admission due to: None     Assessment/Plan:  End stage arthritis, left knee   The patient history, physical examination, clinical judgment of the provider and imaging studies are consistent with end stage degenerative joint disease of the left knee(s) and total knee arthroplasty is deemed medically necessary. The treatment options including medical management, injection therapy arthroscopy and arthroplasty were discussed at length. The risks and benefits of total knee arthroplasty were presented and reviewed. The risks due to aseptic loosening, infection, stiffness, patella tracking problems, thromboembolic complications and other imponderables were discussed. The patient acknowledged the explanation, agreed to proceed with the plan and consent was signed. Patient is being admitted for inpatient treatment for surgery, pain control, PT, OT, prophylactic antibiotics, VTE prophylaxis, progressive ambulation and ADL's and discharge  planning. The patient is planning to be discharged home with home health services    Will use IV tranexamic acid. Contraindications and adverse affects of Tranexamic acid discussed in detail. Patient denies any of these at this time and understands the risks and benefits.

## 2018-09-05 ENCOUNTER — Other Ambulatory Visit: Payer: Self-pay

## 2018-09-05 ENCOUNTER — Encounter (HOSPITAL_COMMUNITY)
Admission: RE | Admit: 2018-09-05 | Discharge: 2018-09-05 | Disposition: A | Discharge status: Home / Self Care | Payer: 59 | Source: Ambulatory Visit | Attending: Specialist | Admitting: Specialist

## 2018-09-05 ENCOUNTER — Encounter (HOSPITAL_COMMUNITY): Payer: Self-pay

## 2018-09-05 DIAGNOSIS — Z01812 Encounter for preprocedural laboratory examination: Secondary | ICD-10-CM | POA: Insufficient documentation

## 2018-09-05 DIAGNOSIS — M1712 Unilateral primary osteoarthritis, left knee: Secondary | ICD-10-CM | POA: Insufficient documentation

## 2018-09-05 HISTORY — DX: Anemia, unspecified: D64.9

## 2018-09-05 LAB — CBC
HEMATOCRIT: 42.7 % (ref 39.0–52.0)
Hemoglobin: 13.8 g/dL (ref 13.0–17.0)
MCH: 29.6 pg (ref 26.0–34.0)
MCHC: 32.3 g/dL (ref 30.0–36.0)
MCV: 91.4 fL (ref 80.0–100.0)
Platelets: 281 10*3/uL (ref 150–400)
RBC: 4.67 MIL/uL (ref 4.22–5.81)
RDW: 15.5 % (ref 11.5–15.5)
WBC: 11.9 10*3/uL — ABNORMAL HIGH (ref 4.0–10.5)
nRBC: 0 % (ref 0.0–0.2)

## 2018-09-05 LAB — URINALYSIS, ROUTINE W REFLEX MICROSCOPIC
Bilirubin Urine: NEGATIVE
GLUCOSE, UA: NEGATIVE mg/dL
Ketones, ur: NEGATIVE mg/dL
Leukocytes, UA: NEGATIVE
Nitrite: NEGATIVE
Protein, ur: NEGATIVE mg/dL
Specific Gravity, Urine: 1.025 (ref 1.005–1.030)
pH: 6 (ref 5.0–8.0)

## 2018-09-05 LAB — BASIC METABOLIC PANEL
Anion gap: 7 (ref 5–15)
BUN: 40 mg/dL — ABNORMAL HIGH (ref 8–23)
CO2: 26 mmol/L (ref 22–32)
CREATININE: 1.26 mg/dL — AB (ref 0.61–1.24)
Calcium: 8.8 mg/dL — ABNORMAL LOW (ref 8.9–10.3)
Chloride: 105 mmol/L (ref 98–111)
GFR calc Af Amer: >60 mL/min (ref >60)
GFR calc non Af Amer: >60 mL/min (ref >60)
Glucose, Bld: 77 mg/dL (ref 70–99)
Potassium: 4.1 mmol/L (ref 3.5–5.1)
Sodium: 138 mmol/L (ref 135–145)

## 2018-09-05 LAB — SURGICAL PCR SCREEN
MRSA, PCR: NEGATIVE
Staphylococcus aureus: NEGATIVE

## 2018-09-05 LAB — APTT: aPTT: 27 seconds (ref 24–36)

## 2018-09-05 LAB — ABO/RH: ABO/RH(D): O NEG

## 2018-09-05 LAB — PROTIME-INR
INR: 0.98
Prothrombin Time: 12.9 seconds (ref 11.4–15.2)

## 2018-09-05 NOTE — Progress Notes (Signed)
PCP:  Dr. Marisue Humble  CARDIOLOGIST:none  INFO IN Epic: BUN [id number]  INFO ON CHART: none   BLOOD THINNERS AND LAST DOSES: none ____________________________________  PATIENT SYMPTOMS AT TIME OF PREOP:none

## 2018-09-13 NOTE — Anesthesia Preprocedure Evaluation (Addendum)
Anesthesia Evaluation  Patient identified by MRN, date of birth, ID band Patient awake    Reviewed: Allergy & Precautions, NPO status , Patient's Chart, lab work & pertinent test results  History of Anesthesia Complications Negative for: history of anesthetic complications  Airway Mallampati: II  TM Distance: >3 FB Neck ROM: Full    Dental  (+) Dental Advisory Given   Pulmonary Current Smoker,    breath sounds clear to auscultation       Cardiovascular negative cardio ROS   Rhythm:Regular Rate:Normal     Neuro/Psych negative neurological ROS     GI/Hepatic Neg liver ROS, GERD  Medicated,  Endo/Other  negative endocrine ROS  Renal/GU negative Renal ROS     Musculoskeletal  (+) Arthritis ,   Abdominal   Peds  Hematology negative hematology ROS (+)   Anesthesia Other Findings   Reproductive/Obstetrics                            Anesthesia Physical Anesthesia Plan  ASA: II  Anesthesia Plan: Spinal   Post-op Pain Management:  Regional for Post-op pain   Induction:   PONV Risk Score and Plan: 0 and Ondansetron and Dexamethasone  Airway Management Planned: Natural Airway and Simple Face Mask  Additional Equipment:   Intra-op Plan:   Post-operative Plan:   Informed Consent: I have reviewed the patients History and Physical, chart, labs and discussed the procedure including the risks, benefits and alternatives for the proposed anesthesia with the patient or authorized representative who has indicated his/her understanding and acceptance.     Dental advisory given  Plan Discussed with: CRNA and Surgeon  Anesthesia Plan Comments: (Plan routine monitors, SAB with adductor canal block for post op analgesia)       Anesthesia Quick Evaluation

## 2018-09-14 ENCOUNTER — Encounter (HOSPITAL_COMMUNITY)
Admission: RE | Disposition: A | Discharge status: Home / Self Care | Payer: Self-pay | Source: Other Acute Inpatient Hospital | Attending: Specialist

## 2018-09-14 ENCOUNTER — Ambulatory Visit (HOSPITAL_COMMUNITY): Payer: 59 | Admitting: Physician Assistant

## 2018-09-14 ENCOUNTER — Ambulatory Visit (HOSPITAL_COMMUNITY)
Admission: RE | Admit: 2018-09-14 | Discharge: 2018-09-16 | Disposition: A | Discharge status: Home / Self Care | Payer: 59 | Source: Other Acute Inpatient Hospital | Attending: Specialist | Admitting: Specialist

## 2018-09-14 ENCOUNTER — Ambulatory Visit (HOSPITAL_COMMUNITY): Payer: 59 | Admitting: Anesthesiology

## 2018-09-14 ENCOUNTER — Encounter (HOSPITAL_COMMUNITY): Payer: Self-pay | Admitting: *Deleted

## 2018-09-14 ENCOUNTER — Other Ambulatory Visit: Payer: Self-pay

## 2018-09-14 DIAGNOSIS — F17290 Nicotine dependence, other tobacco product, uncomplicated: Secondary | ICD-10-CM | POA: Diagnosis not present

## 2018-09-14 DIAGNOSIS — Z818 Family history of other mental and behavioral disorders: Secondary | ICD-10-CM | POA: Diagnosis not present

## 2018-09-14 DIAGNOSIS — M1712 Unilateral primary osteoarthritis, left knee: Secondary | ICD-10-CM | POA: Diagnosis not present

## 2018-09-14 DIAGNOSIS — Z801 Family history of malignant neoplasm of trachea, bronchus and lung: Secondary | ICD-10-CM | POA: Insufficient documentation

## 2018-09-14 DIAGNOSIS — K219 Gastro-esophageal reflux disease without esophagitis: Secondary | ICD-10-CM | POA: Diagnosis not present

## 2018-09-14 DIAGNOSIS — Z79899 Other long term (current) drug therapy: Secondary | ICD-10-CM | POA: Diagnosis not present

## 2018-09-14 DIAGNOSIS — E785 Hyperlipidemia, unspecified: Secondary | ICD-10-CM | POA: Insufficient documentation

## 2018-09-14 DIAGNOSIS — Z96659 Presence of unspecified artificial knee joint: Secondary | ICD-10-CM

## 2018-09-14 DIAGNOSIS — Z791 Long term (current) use of non-steroidal anti-inflammatories (NSAID): Secondary | ICD-10-CM | POA: Insufficient documentation

## 2018-09-14 DIAGNOSIS — Z811 Family history of alcohol abuse and dependence: Secondary | ICD-10-CM | POA: Insufficient documentation

## 2018-09-14 DIAGNOSIS — G8918 Other acute postprocedural pain: Secondary | ICD-10-CM | POA: Diagnosis not present

## 2018-09-14 HISTORY — PX: TOTAL KNEE ARTHROPLASTY: SHX125

## 2018-09-14 LAB — TYPE AND SCREEN
ABO/RH(D): O NEG
Antibody Screen: NEGATIVE

## 2018-09-14 SURGERY — ARTHROPLASTY, KNEE, TOTAL
Anesthesia: Spinal | Site: Knee | Laterality: Left

## 2018-09-14 MED ORDER — BUPIVACAINE IN DEXTROSE 0.75-8.25 % IT SOLN
INTRATHECAL | Status: DC | PRN
  Administered 2018-09-14: 1.8 mL via INTRATHECAL

## 2018-09-14 MED ORDER — PROPOFOL 10 MG/ML IV BOLUS
INTRAVENOUS | Status: DC | PRN
  Administered 2018-09-14: 20 mg via INTRAVENOUS
  Administered 2018-09-14: 30 mg via INTRAVENOUS

## 2018-09-14 MED ORDER — OXYCODONE HCL 5 MG PO TABS
5.0000 mg | ORAL_TABLET | ORAL | Status: DC | PRN
  Administered 2018-09-14: 10 mg via ORAL
  Administered 2018-09-14: 5 mg via ORAL
  Administered 2018-09-14: 10 mg via ORAL
  Administered 2018-09-14: 5 mg via ORAL
  Administered 2018-09-15 (×2): 10 mg via ORAL
  Administered 2018-09-15: 5 mg via ORAL
  Administered 2018-09-15 (×3): 10 mg via ORAL
  Filled 2018-09-14: qty 1
  Filled 2018-09-14 (×4): qty 2
  Filled 2018-09-14 (×2): qty 1
  Filled 2018-09-14 (×3): qty 2

## 2018-09-14 MED ORDER — FERROUS SULFATE 325 (65 FE) MG PO TABS
325.0000 mg | ORAL_TABLET | Freq: Three times a day (TID) | ORAL | Status: DC
  Administered 2018-09-15 – 2018-09-16 (×5): 325 mg via ORAL
  Filled 2018-09-14 (×5): qty 1

## 2018-09-14 MED ORDER — PROMETHAZINE HCL 25 MG/ML IJ SOLN: 6.2500 mg | INTRAMUSCULAR | Status: DC | PRN

## 2018-09-14 MED ORDER — ROPIVACAINE HCL 7.5 MG/ML IJ SOLN
INTRAMUSCULAR | Status: DC | PRN
  Administered 2018-09-14: 20 mL via PERINEURAL

## 2018-09-14 MED ORDER — PROPOFOL 10 MG/ML IV BOLUS
INTRAVENOUS | Status: AC
  Filled 2018-09-14: qty 60

## 2018-09-14 MED ORDER — ONDANSETRON HCL 4 MG/2ML IJ SOLN
INTRAMUSCULAR | Status: AC
  Filled 2018-09-14: qty 2

## 2018-09-14 MED ORDER — FENTANYL CITRATE (PF) 100 MCG/2ML IJ SOLN
INTRAMUSCULAR | Status: DC | PRN
  Administered 2018-09-14 (×2): 50 ug via INTRAVENOUS

## 2018-09-14 MED ORDER — SODIUM CHLORIDE 0.9 % IV SOLN
INTRAVENOUS | Status: DC
  Administered 2018-09-14 – 2018-09-15 (×2): via INTRAVENOUS

## 2018-09-14 MED ORDER — ONDANSETRON HCL 4 MG/2ML IJ SOLN: 4.0000 mg | Freq: Four times a day (QID) | INTRAMUSCULAR | Status: DC | PRN

## 2018-09-14 MED ORDER — SODIUM CHLORIDE (PF) 0.9 % IJ SOLN: INTRAMUSCULAR | Status: DC | PRN

## 2018-09-14 MED ORDER — ONDANSETRON HCL 4 MG PO TABS: 4.0000 mg | ORAL_TABLET | Freq: Four times a day (QID) | ORAL | Status: DC | PRN

## 2018-09-14 MED ORDER — ACETAMINOPHEN 500 MG PO TABS
1000.0000 mg | ORAL_TABLET | Freq: Four times a day (QID) | ORAL | Status: AC
  Administered 2018-09-14 – 2018-09-15 (×4): 1000 mg via ORAL
  Filled 2018-09-14 (×4): qty 2

## 2018-09-14 MED ORDER — KETOROLAC TROMETHAMINE 30 MG/ML IJ SOLN: INTRAMUSCULAR | Status: DC | PRN

## 2018-09-14 MED ORDER — DEXAMETHASONE SODIUM PHOSPHATE 10 MG/ML IJ SOLN
10.0000 mg | Freq: Once | INTRAMUSCULAR | Status: AC
  Administered 2018-09-15: 10 mg via INTRAVENOUS
  Filled 2018-09-14: qty 1

## 2018-09-14 MED ORDER — METOCLOPRAMIDE HCL 5 MG PO TABS: 5.0000 mg | ORAL_TABLET | Freq: Three times a day (TID) | ORAL | Status: DC | PRN

## 2018-09-14 MED ORDER — HYDROMORPHONE HCL 1 MG/ML IJ SOLN
0.5000 mg | INTRAMUSCULAR | Status: DC | PRN
  Administered 2018-09-14 – 2018-09-15 (×2): 1 mg via INTRAVENOUS
  Filled 2018-09-14 (×2): qty 1

## 2018-09-14 MED ORDER — CHLORHEXIDINE GLUCONATE 4 % EX LIQD: 60.0000 mL | Freq: Once | CUTANEOUS | Status: DC

## 2018-09-14 MED ORDER — BUPIVACAINE HCL (PF) 0.25 % IJ SOLN: INTRAMUSCULAR | Status: DC | PRN

## 2018-09-14 MED ORDER — PROPOFOL 10 MG/ML IV BOLUS
INTRAVENOUS | Status: AC
  Filled 2018-09-14: qty 20

## 2018-09-14 MED ORDER — DEXAMETHASONE SODIUM PHOSPHATE 10 MG/ML IJ SOLN
INTRAMUSCULAR | Status: AC
  Filled 2018-09-14: qty 1

## 2018-09-14 MED ORDER — MIDAZOLAM HCL 2 MG/2ML IJ SOLN
INTRAMUSCULAR | Status: AC
  Filled 2018-09-14: qty 2

## 2018-09-14 MED ORDER — OXYCODONE HCL 5 MG PO TABS: 5.0000 mg | ORAL_TABLET | ORAL | 0 refills | Status: AC | PRN

## 2018-09-14 MED ORDER — BISACODYL 5 MG PO TBEC: 5.0000 mg | DELAYED_RELEASE_TABLET | Freq: Every day | ORAL | Status: DC | PRN

## 2018-09-14 MED ORDER — DOCUSATE SODIUM 100 MG PO CAPS
100.0000 mg | ORAL_CAPSULE | Freq: Two times a day (BID) | ORAL | Status: DC
  Administered 2018-09-14 – 2018-09-16 (×4): 100 mg via ORAL
  Filled 2018-09-14 (×4): qty 1

## 2018-09-14 MED ORDER — ALUM & MAG HYDROXIDE-SIMETH 200-200-20 MG/5ML PO SUSP: 30.0000 mL | ORAL | Status: DC | PRN

## 2018-09-14 MED ORDER — MEPERIDINE HCL 50 MG/ML IJ SOLN: 6.2500 mg | INTRAMUSCULAR | Status: DC | PRN

## 2018-09-14 MED ORDER — BUPIVACAINE HCL (PF) 0.25 % IJ SOLN
INTRAMUSCULAR | Status: AC
  Filled 2018-09-14: qty 30

## 2018-09-14 MED ORDER — DIPHENHYDRAMINE HCL 12.5 MG/5ML PO ELIX: 12.5000 mg | ORAL_SOLUTION | ORAL | Status: DC | PRN

## 2018-09-14 MED ORDER — SODIUM CHLORIDE 0.9 % IR SOLN
Status: DC | PRN
  Administered 2018-09-14: 1

## 2018-09-14 MED ORDER — PROPOFOL 500 MG/50ML IV EMUL
INTRAVENOUS | Status: DC | PRN
  Administered 2018-09-14: 75 ug/kg/min via INTRAVENOUS

## 2018-09-14 MED ORDER — OXYCODONE HCL 5 MG PO TABS
10.0000 mg | ORAL_TABLET | ORAL | Status: DC | PRN
  Administered 2018-09-16 (×3): 15 mg via ORAL
  Filled 2018-09-14 (×3): qty 3

## 2018-09-14 MED ORDER — CEFAZOLIN SODIUM-DEXTROSE 2-4 GM/100ML-% IV SOLN
2.0000 g | INTRAVENOUS | Status: AC
  Administered 2018-09-14: 2 g via INTRAVENOUS
  Filled 2018-09-14: qty 100

## 2018-09-14 MED ORDER — PHENOL 1.4 % MT LIQD
1.0000 | OROMUCOSAL | Status: DC | PRN
  Filled 2018-09-14: qty 177

## 2018-09-14 MED ORDER — MAGNESIUM CITRATE PO SOLN: 1.0000 | Freq: Once | ORAL | Status: DC | PRN

## 2018-09-14 MED ORDER — KETOROLAC TROMETHAMINE 30 MG/ML IJ SOLN
INTRAMUSCULAR | Status: AC
  Filled 2018-09-14: qty 1

## 2018-09-14 MED ORDER — FENTANYL CITRATE (PF) 100 MCG/2ML IJ SOLN
INTRAMUSCULAR | Status: AC
  Filled 2018-09-14: qty 2

## 2018-09-14 MED ORDER — ONDANSETRON HCL 4 MG/2ML IJ SOLN
INTRAMUSCULAR | Status: DC | PRN
  Administered 2018-09-14: 4 mg via INTRAVENOUS

## 2018-09-14 MED ORDER — ENOXAPARIN SODIUM 30 MG/0.3ML ~~LOC~~ SOLN
30.0000 mg | Freq: Two times a day (BID) | SUBCUTANEOUS | Status: DC
  Administered 2018-09-15 – 2018-09-16 (×3): 30 mg via SUBCUTANEOUS
  Filled 2018-09-14 (×3): qty 0.3

## 2018-09-14 MED ORDER — ACETAMINOPHEN 325 MG PO TABS
325.0000 mg | ORAL_TABLET | Freq: Four times a day (QID) | ORAL | Status: DC | PRN
  Administered 2018-09-15 – 2018-09-16 (×2): 650 mg via ORAL
  Filled 2018-09-14 (×2): qty 2

## 2018-09-14 MED ORDER — METHOCARBAMOL 1000 MG/10ML IJ SOLN
500.0000 mg | Freq: Four times a day (QID) | INTRAVENOUS | Status: DC | PRN
  Filled 2018-09-14: qty 5

## 2018-09-14 MED ORDER — LACTATED RINGERS IV SOLN
INTRAVENOUS | Status: DC
  Administered 2018-09-14: 06:00:00 via INTRAVENOUS

## 2018-09-14 MED ORDER — PRAVASTATIN SODIUM 40 MG PO TABS
40.0000 mg | ORAL_TABLET | Freq: Every day | ORAL | Status: DC
  Administered 2018-09-14 – 2018-09-15 (×2): 40 mg via ORAL
  Filled 2018-09-14: qty 2
  Filled 2018-09-14: qty 1
  Filled 2018-09-14: qty 2
  Filled 2018-09-14 (×2): qty 1

## 2018-09-14 MED ORDER — CYCLOBENZAPRINE HCL 10 MG PO TABS
10.0000 mg | ORAL_TABLET | Freq: Three times a day (TID) | ORAL | Status: DC | PRN
  Administered 2018-09-14 – 2018-09-16 (×5): 10 mg via ORAL
  Filled 2018-09-14 (×5): qty 1

## 2018-09-14 MED ORDER — PANTOPRAZOLE SODIUM 40 MG PO TBEC
40.0000 mg | DELAYED_RELEASE_TABLET | Freq: Every day | ORAL | Status: DC
  Administered 2018-09-15 – 2018-09-16 (×2): 40 mg via ORAL
  Filled 2018-09-14 (×2): qty 1

## 2018-09-14 MED ORDER — DEXAMETHASONE SODIUM PHOSPHATE 10 MG/ML IJ SOLN
10.0000 mg | Freq: Once | INTRAMUSCULAR | Status: AC
  Administered 2018-09-14: 10 mg via INTRAVENOUS

## 2018-09-14 MED ORDER — POLYETHYLENE GLYCOL 3350 17 G PO PACK
17.0000 g | PACK | Freq: Every day | ORAL | Status: DC | PRN
  Administered 2018-09-15: 17 g via ORAL
  Filled 2018-09-14: qty 1

## 2018-09-14 MED ORDER — CEFAZOLIN SODIUM-DEXTROSE 2-4 GM/100ML-% IV SOLN
2.0000 g | Freq: Four times a day (QID) | INTRAVENOUS | Status: AC
  Administered 2018-09-14 (×2): 2 g via INTRAVENOUS
  Filled 2018-09-14 (×2): qty 100

## 2018-09-14 MED ORDER — TRANEXAMIC ACID-NACL 1000-0.7 MG/100ML-% IV SOLN
1000.0000 mg | INTRAVENOUS | Status: AC
  Administered 2018-09-14: 1000 mg via INTRAVENOUS
  Filled 2018-09-14: qty 100

## 2018-09-14 MED ORDER — MIDAZOLAM HCL 2 MG/2ML IJ SOLN: 0.5000 mg | Freq: Once | INTRAMUSCULAR | Status: DC | PRN

## 2018-09-14 MED ORDER — SODIUM CHLORIDE (PF) 0.9 % IJ SOLN
INTRAMUSCULAR | Status: AC
  Filled 2018-09-14: qty 50

## 2018-09-14 MED ORDER — METOCLOPRAMIDE HCL 5 MG/ML IJ SOLN: 5.0000 mg | Freq: Three times a day (TID) | INTRAMUSCULAR | Status: DC | PRN

## 2018-09-14 MED ORDER — MIDAZOLAM HCL 5 MG/5ML IJ SOLN
INTRAMUSCULAR | Status: DC | PRN
  Administered 2018-09-14 (×2): 1 mg via INTRAVENOUS

## 2018-09-14 MED ORDER — MENTHOL 3 MG MT LOZG: 1.0000 | LOZENGE | OROMUCOSAL | Status: DC | PRN

## 2018-09-14 MED ORDER — HYDROMORPHONE HCL 1 MG/ML IJ SOLN: 0.2500 mg | INTRAMUSCULAR | Status: DC | PRN

## 2018-09-14 MED ORDER — ASPIRIN EC 325 MG PO TBEC: 325.0000 mg | DELAYED_RELEASE_TABLET | Freq: Two times a day (BID) | ORAL | 0 refills | Status: AC

## 2018-09-14 MED ORDER — SODIUM CHLORIDE 0.9 % IV SOLN
INTRAVENOUS | Status: DC | PRN
  Administered 2018-09-14: 40 ug/min via INTRAVENOUS

## 2018-09-14 SURGICAL SUPPLY — 72 items
ADH SKN CLS APL DERMABOND .7 (GAUZE/BANDAGES/DRESSINGS) ×1
ATTUNE PS FEM LT SZ 8 CEM KNEE (Femur) ×2 IMPLANT
ATTUNE PSRP INSR SZ8 8 KNEE (Insert) ×1 IMPLANT
ATTUNE PSRP INSR SZ8 8MM KNEE (Insert) ×1 IMPLANT
BAG DECANTER FOR FLEXI CONT (MISCELLANEOUS) IMPLANT
BAG SPEC THK2 15X12 ZIP CLS (MISCELLANEOUS) ×2
BAG ZIPLOCK 12X15 (MISCELLANEOUS) ×6 IMPLANT
BANDAGE ACE 4X5 VEL STRL LF (GAUZE/BANDAGES/DRESSINGS) ×3 IMPLANT
BANDAGE ACE 6X5 VEL STRL LF (GAUZE/BANDAGES/DRESSINGS) ×3 IMPLANT
BASE TIBIAL ROT PLAT SZ 7 KNEE (Knees) IMPLANT
BLADE SAG 18X100X1.27 (BLADE) ×3 IMPLANT
BLADE SAW SGTL 11.0X1.19X90.0M (BLADE) ×3 IMPLANT
BLADE SURG SZ10 CARB STEEL (BLADE) ×6 IMPLANT
BNDG CMPR MED 15X6 ELC VLCR LF (GAUZE/BANDAGES/DRESSINGS) ×1
BNDG ELASTIC 6X15 VLCR STRL LF (GAUZE/BANDAGES/DRESSINGS) ×2 IMPLANT
BOWL SMART MIX CTS (DISPOSABLE) ×3 IMPLANT
BSPLAT TIB 7 CMNT ROT PLAT STR (Knees) ×1 IMPLANT
CEMENT HV SMART SET (Cement) ×4 IMPLANT
COVER SURGICAL LIGHT HANDLE (MISCELLANEOUS) ×3 IMPLANT
COVER WAND RF STERILE (DRAPES) ×2 IMPLANT
CUFF TOURN SGL QUICK 34 (TOURNIQUET CUFF) ×3
CUFF TRNQT CYL 34X4X40X1 (TOURNIQUET CUFF) ×1 IMPLANT
DECANTER SPIKE VIAL GLASS SM (MISCELLANEOUS) ×3 IMPLANT
DERMABOND ADVANCED (GAUZE/BANDAGES/DRESSINGS) ×2
DERMABOND ADVANCED .7 DNX12 (GAUZE/BANDAGES/DRESSINGS) ×1 IMPLANT
DRAPE U-SHAPE 47X51 STRL (DRAPES) ×3 IMPLANT
DRESSING AQUACEL AG SP 3.5X10 (GAUZE/BANDAGES/DRESSINGS) IMPLANT
DRSG AQUACEL AG ADV 3.5X10 (GAUZE/BANDAGES/DRESSINGS) ×3 IMPLANT
DRSG AQUACEL AG SP 3.5X10 (GAUZE/BANDAGES/DRESSINGS) ×3
DRSG TEGADERM 4X4.75 (GAUZE/BANDAGES/DRESSINGS) ×3 IMPLANT
DURAPREP 26ML APPLICATOR (WOUND CARE) ×6 IMPLANT
ELECT REM PT RETURN 15FT ADLT (MISCELLANEOUS) ×3 IMPLANT
EVACUATOR 1/8 PVC DRAIN (DRAIN) ×3 IMPLANT
GAUZE SPONGE 2X2 8PLY STRL LF (GAUZE/BANDAGES/DRESSINGS) ×1 IMPLANT
GLOVE BIO SURGEON STRL SZ7.5 (GLOVE) ×6 IMPLANT
GLOVE BIOGEL PI IND STRL 8 (GLOVE) ×2 IMPLANT
GLOVE BIOGEL PI INDICATOR 8 (GLOVE) ×4
GLOVE ECLIPSE 8.0 STRL XLNG CF (GLOVE) ×6 IMPLANT
GLOVE SURG ORTHO 9.0 STRL STRW (GLOVE) ×3 IMPLANT
GOWN STRL REUS W/TWL XL LVL3 (GOWN DISPOSABLE) ×6 IMPLANT
HANDPIECE INTERPULSE COAX TIP (DISPOSABLE) ×3
HOLDER FOLEY CATH W/STRAP (MISCELLANEOUS) ×2 IMPLANT
NDL SAFETY ECLIPSE 18X1.5 (NEEDLE) ×1 IMPLANT
NEEDLE HYPO 18GX1.5 SHARP (NEEDLE) ×3
NS IRRIG 1000ML POUR BTL (IV SOLUTION) ×3 IMPLANT
PACK TOTAL KNEE CUSTOM (KITS) ×3 IMPLANT
PATELLA MEDIAL ATTUN 35MM KNEE (Knees) ×2 IMPLANT
PIN STEINMAN FIXATION KNEE (PIN) ×2 IMPLANT
PIN THREADED HEADED SIGMA (PIN) ×2 IMPLANT
PROTECTOR NERVE ULNAR (MISCELLANEOUS) ×3 IMPLANT
SET HNDPC FAN SPRY TIP SCT (DISPOSABLE) ×1 IMPLANT
SET PAD KNEE POSITIONER (MISCELLANEOUS) ×3 IMPLANT
SPONGE GAUZE 2X2 STER 10/PKG (GAUZE/BANDAGES/DRESSINGS) ×2
SPONGE LAP 18X18 RF (DISPOSABLE) IMPLANT
SPONGE SURGIFOAM ABS GEL 100 (HEMOSTASIS) ×3 IMPLANT
STOCKINETTE 6  STRL (DRAPES) ×2
STOCKINETTE 6 STRL (DRAPES) ×1 IMPLANT
SUT BONE WAX W31G (SUTURE) IMPLANT
SUT MNCRL AB 3-0 PS2 18 (SUTURE) ×3 IMPLANT
SUT VIC AB 1 CT1 27 (SUTURE) ×12
SUT VIC AB 1 CT1 27XBRD ANTBC (SUTURE) ×4 IMPLANT
SUT VIC AB 2-0 CT1 27 (SUTURE) ×6
SUT VIC AB 2-0 CT1 TAPERPNT 27 (SUTURE) ×2 IMPLANT
SUT VLOC 180 0 24IN GS25 (SUTURE) ×3 IMPLANT
SYR 3ML LL SCALE MARK (SYRINGE) ×3 IMPLANT
SYR 50ML LL SCALE MARK (SYRINGE) ×3 IMPLANT
TAPE STRIPS DRAPE STRL (GAUZE/BANDAGES/DRESSINGS) ×3 IMPLANT
TIBIAL BASE ROT PLAT SZ 7 KNEE (Knees) ×3 IMPLANT
TRAY FOLEY MTR SLVR 16FR STAT (SET/KITS/TRAYS/PACK) ×3 IMPLANT
WATER STERILE IRR 1000ML POUR (IV SOLUTION) ×6 IMPLANT
WRAP KNEE MAXI GEL POST OP (GAUZE/BANDAGES/DRESSINGS) ×3 IMPLANT
YANKAUER SUCT BULB TIP 10FT TU (MISCELLANEOUS) ×3 IMPLANT

## 2018-09-14 NOTE — Evaluation (Signed)
Physical Therapy Evaluation Patient Details Name: Manuel Harper MRN: [id number] DOB: 1956/07/26 Today's Date: 09/14/2018   History of Present Illness  Patient is a 63 y/o male s/p L TKA on 09/14/18. PMH significant for lumbar stenosis with prior back surgery.   Clinical Impression  Patient admitted with the above listed diagnosis. Patient with expected post-op pain and weakness. Use of L knee immobilizer with patient demonstrating good tolerance to weight bearing on surgical LE. Min A for bed mobility and transfers, min guard with gait with RW. Discussion on expectations of therapy with good verbal understanding. Will continue to follow.     Follow Up Recommendations Follow surgeon's recommendation for DC plan and follow-up therapies    Equipment Recommendations  Rolling walker with 5" wheels;3in1 (PT)    Recommendations for Other Services       Precautions / Restrictions Precautions Precautions: Fall;Knee Precaution Comments: reviewed no pillow under knee Required Braces or Orthoses: Knee Immobilizer - Left Knee Immobilizer - Left: Discontinue once straight leg raise with < 10 degree lag Restrictions Weight Bearing Restrictions: Yes      Mobility  Bed Mobility Overal bed mobility: Needs Assistance Bed Mobility: Supine to Sit     Supine to sit: Min assist     General bed mobility comments: Min A for L LE management  Transfers Overall transfer level: Needs assistance Equipment used: Rolling walker (2 wheeled) Transfers: Sit to/from Stand Sit to Stand: Min assist;Min guard         General transfer comment: light Min A at EOB; once in standing patient able to weight shift without increase in pain  Ambulation/Gait Ambulation/Gait assistance: Min guard Gait Distance (Feet): 100 Feet Assistive device: Rolling walker (2 wheeled) Gait Pattern/deviations: Step-to pattern;Step-through pattern;Decreased stride length;Decreased weight shift to left;Antalgic Gait velocity:  decreased   General Gait Details: use of UE to offload L LE - good weight shift  Stairs            Wheelchair Mobility    Modified Rankin (Stroke Patients Only)       Balance Overall balance assessment: Mild deficits observed, not formally tested                                           Pertinent Vitals/Pain Pain Assessment: 0-10 Pain Score: 4  Pain Location: L knee Pain Descriptors / Indicators: Burning Pain Intervention(s): Limited activity within patient's tolerance;Monitored during session;Repositioned;Ice applied    Home Living Family/patient expects to be discharged to:: Private residence Living Arrangements: Spouse/significant other Available Help at Discharge: Family;Available 24 hours/day Type of Home: House Home Access: Ramped entrance     Home Layout: Two level;Able to live on main level with bedroom/bathroom Home Equipment: [name] - single point;Shower seat      Prior Function Level of Independence: Independent               Hand Dominance        Extremity/Trunk Assessment   Upper Extremity Assessment Upper Extremity Assessment: Overall WFL for tasks assessed    Lower Extremity Assessment Lower Extremity Assessment: Generalized weakness;LLE deficits/detail LLE Deficits / Details: expected post-op pain and weakness    Cervical / Trunk Assessment Cervical / Trunk Assessment: Normal  Communication   Communication: No difficulties  Cognition Arousal/Alertness: Awake/alert Behavior During Therapy: WFL for tasks assessed/performed Overall Cognitive Status: Within Functional Limits for tasks assessed  General Comments      Exercises     Assessment/Plan    PT Assessment Patient needs continued PT services  PT Problem List Decreased strength;Decreased activity tolerance;Decreased range of motion;Decreased balance;Decreased mobility;Decreased knowledge of use  of DME;Decreased safety awareness       PT Treatment Interventions DME instruction;Gait training;Stair training;Therapeutic activities;Functional mobility training;Therapeutic exercise;Balance training;Patient/family education    PT Goals (Current goals can be found in the Care Plan section)  Acute Rehab PT Goals Patient Stated Goal: regain strength and mobility PT Goal Formulation: With patient Time For Goal Achievement: 09/21/18 Potential to Achieve Goals: Good    Frequency 7X/week   Barriers to discharge        Co-evaluation               AM-PAC PT "6 Clicks" Mobility  Outcome Measure Help needed turning from your back to your side while in a flat bed without using bedrails?: A Little Help needed moving from lying on your back to sitting on the side of a flat bed without using bedrails?: A Little Help needed moving to and from a bed to a chair (including a wheelchair)?: A Little Help needed standing up from a chair using your arms (e.g., wheelchair or bedside chair)?: A Little Help needed to walk in hospital room?: A Little Help needed climbing 3-5 steps with a railing? : A Lot 6 Click Score: 17    End of Session Equipment Utilized During Treatment: Gait belt Activity Tolerance: Patient tolerated treatment well Patient left: in chair;with call bell/phone within reach;with family/visitor present Nurse Communication: Mobility status PT Visit Diagnosis: Unsteadiness on feet (R26.81);Other abnormalities of gait and mobility (R26.89);Muscle weakness (generalized) (M62.81)    Time: 4825-0037 PT Time Calculation (min) (ACUTE ONLY): 44 min   Charges:   PT Evaluation $PT Eval Moderate Complexity: 1 Mod PT Treatments $Gait Training: 8-22 mins        Lanney Gins, PT, DPT Supplemental Physical Therapist 09/14/18 3:28 PM Pager: [phone] Office: [phone]

## 2018-09-14 NOTE — Transfer of Care (Signed)
Immediate Anesthesia Transfer of Care Note  Patient: Manuel Harper  Procedure(s) Performed: Procedure(s): TOTAL KNEE ARTHROPLASTY (Left)  Patient Location: PACU  Anesthesia Type:Spinal  Level of Consciousness:  sedated, patient cooperative and responds to stimulation  Airway & Oxygen Therapy:Patient Spontanous Breathing and Patient connected to face mask oxgen  Post-op Assessment:  Report given to PACU RN and Post -op Vital signs reviewed and stable  Post vital signs:  Reviewed and stable  Last Vitals:  Vitals:   09/14/18 0607  BP: (!) 137/91  Pulse: 77  Resp: 18  Temp: 36.7 C  SpO2: 43%    Complications: No apparent anesthesia complications

## 2018-09-14 NOTE — Anesthesia Procedure Notes (Signed)
Spinal  Patient location during procedure: OR Start time: 09/14/2018 7:38 AM End time: 09/14/2018 7:43 AM Reason for block: at surgeon's request Staffing Resident/CRNA: Anne Fu, CRNA Performed: resident/CRNA  Preanesthetic Checklist Completed: patient identified, site marked, surgical consent, pre-op evaluation, timeout performed, IV checked, risks and benefits discussed and monitors and equipment checked Spinal Block Patient position: sitting Prep: DuraPrep Patient monitoring: heart rate, continuous pulse ox and blood pressure Approach: midline Location: L3-4 Injection technique: single-shot Needle Needle type: Pencan  Needle gauge: 24 G Needle length: 9 cm Assessment Sensory level: T6 Additional Notes  Functioning IV was confirmed and monitors were applied. Expiration date of kit checked and confirmed. Sterile prep and drape, including hand hygiene and sterile gloves were used. The patient was positioned and the spine was prepped. The skin was anesthetized with lidocaine.  Free flow of clear CSF was obtained prior to injecting local anesthetic into the CSF X 1 attempt.  The spinal needle aspirated freely following injection.  The needle was carefully withdrawn. Patient tolerated procedure well, without complications. Loss of motor and sensory on exam post injection.

## 2018-09-14 NOTE — Interval H&P Note (Signed)
History and Physical Interval Note:  09/14/2018 7:29 AM  Manuel Harper  has presented today for surgery, with the diagnosis of Left knee osteoarthritis  The various methods of treatment have been discussed with the patient and family. After consideration of risks, benefits and other options for treatment, the patient has consented to  Procedure(s): TOTAL KNEE ARTHROPLASTY (Left) as a surgical intervention .  The patient's history has been reviewed, patient examined, no change in status, stable for surgery.  I have reviewed the patient's chart and labs.  Questions were answered to the patient's satisfaction.     Aneliz Carbary ANDREW

## 2018-09-14 NOTE — Plan of Care (Signed)
Plan of care 

## 2018-09-14 NOTE — Op Note (Signed)
DATE OF SURGERY:  09/14/2018  TIME: 9:38 AM  PATIENT NAME:  Manuel Harper    AGE: 63 y.o.   PRE-OPERATIVE DIAGNOSIS:  Left knee osteoarthritis  POST-OPERATIVE DIAGNOSIS:  Left knee osteoarthritis  PROCEDURE:  Procedure(s): TOTAL KNEE ARTHROPLASTY  SURGEON:  Aahana Elza ANDREW  ASSISTANT:  Bryson Stilwell, PA-C, present and scrubbed throughout the case, critical for assistance with exposure, retraction, instrumentation, and closure.  OPERATIVE IMPLANTS: Depuy Attune Chief Technology Officer.  Femur size 8, Tibia size 7, Patella size 35 3-peg oval button, with a 8 mm polyethylene insert.   PREOPERATIVE INDICATIONS:   Manuel Harper is a 63 y.o. year old male with end stage bone on bone arthritis of the knee who failed conservative treatment and elected for Total Knee Arthroplasty.   The risks, benefits, and alternatives were discussed at length including but not limited to the risks of infection, bleeding, nerve injury, stiffness, blood clots, the need for revision surgery, cardiopulmonary complications, among others, and they were willing to proceed.  OPERATIVE DESCRIPTION:  The patient was brought to the operative room and placed in a supine position.  Spinal anesthesia was administered.  IV antibiotics were given.  The lower extremity was prepped and draped in the usual sterile fashion.  Time out was performed.  The leg was elevated and exsanguinated and the tourniquet was inflated.  Anterior quadriceps tendon splitting approach was performed.  The patella was retracted and osteophytes were removed.  The anterior horn of the medial and lateral meniscus was removed and cruciate ligaments resected.   The distal femur was opened with the drill and the intramedullary distal femoral cutting jig was utilized, set at 5 degrees resecting 10 mm off the distal femur.  Care was taken to protect the collateral ligaments.  The distal femoral sizing jig was applied, taking care to avoid  notching.  Then the 4-in-1 cutting jig was applied and the anterior and posterior femur was cut, along with the chamfer cuts.    Then the extramedullary tibial cutting jig was utilized making the appropriate cut using the anterior tibial crest as a reference building in appropriate posterior slope.  Care was taken during the cut to protect the medial and collateral ligaments.  The proximal tibia was removed along with the posterior horns of the menisci.   The posterior medial femoral osteophytes and posterior lateral femoral osteophytes were removed.    The flexion gap was then measured and was symmetric with the extension gap, measured at 8.  I completed the distal femoral preparation using the appropriate jig to prepare the box.  The patella was then measured, and cut with the saw.    The proximal tibia sized and prepared accordingly with the reamer and the punch, and then all components were trialed with the trial insert.  The knee was found to have excellent balance and full motion.    The above named components were then cemented into place and all excess cement was removed.  The trial polyethylene component was in place during cementation, and then was exchanged for the real polyethylene component.    The knee was easily taken through a range of motion and the patella tracked well and the knee irrigated copiously and the parapatellar and subcutaneous tissue closed with vicryl, and monocryl with steri strips for the skin.  The arthrotomy was closed at 90 of flexion. The wounds were dressed with sterile gauze and the tourniquet released and the patient was awakened and returned to the PACU in  stable and satisfactory condition.  There were no complications.  Total tourniquet time was 80 minutes.

## 2018-09-14 NOTE — Anesthesia Procedure Notes (Signed)
Anesthesia Regional Block: Adductor canal block   Pre-Anesthetic Checklist: ,, timeout performed, Correct Patient, Correct Site, Correct Laterality, Correct Procedure, Correct Position, site marked, Risks and benefits discussed,  Surgical consent,  Pre-op evaluation,  At surgeon's request and post-op pain management  Laterality: Left and Lower  Prep: chloraprep       Needles:  Injection technique: Single-shot  Needle Type: Echogenic Needle     Needle Length: 9cm  Needle Gauge: 21     Additional Needles:   Procedures:,,,, ultrasound used (permanent image in chart),,,,  Narrative:  Start time: 09/14/2018 7:05 AM End time: 09/14/2018 7:11 AM Injection made incrementally with aspirations every 5 mL.  Performed by: Personally  Anesthesiologist: Annye Asa, MD  Additional Notes: Pt identified in Holding room.  Monitors applied. Working IV access confirmed. Sterile prep.  #21ga ECHOgenic needle into adductor canal with US guidance.  20cc 0.75% Ropivacaine injected incrementally after negative test dose.  Patient asymptomatic, VSS, no heme aspirated, tolerated well.  Jenita Seashore, MD

## 2018-09-14 NOTE — Anesthesia Postprocedure Evaluation (Signed)
Anesthesia Post Note  Patient: Manuel Harper  Procedure(s) Performed: TOTAL KNEE ARTHROPLASTY (Left Knee)     Patient location during evaluation: PACU Anesthesia Type: Spinal and Regional Level of consciousness: awake and alert, patient cooperative and oriented Pain management: pain level controlled Vital Signs Assessment: post-procedure vital signs reviewed and stable Respiratory status: spontaneous breathing, nonlabored ventilation and respiratory function stable Cardiovascular status: blood pressure returned to baseline Postop Assessment: spinal receding and no apparent nausea or vomiting Anesthetic complications: no    Last Vitals:  Vitals:   09/14/18 1356 09/14/18 1447  BP: (!) 133/94 (!) 125/99  Pulse: (!) 112 (!) 115  Resp:  18  Temp: 36.8 C 36.7 C  SpO2: 97% 96%    Last Pain:  Vitals:   09/14/18 1450  TempSrc:   PainSc: 6                  Trudy Kory,E. Babetta Paterson

## 2018-09-15 DIAGNOSIS — M1712 Unilateral primary osteoarthritis, left knee: Secondary | ICD-10-CM | POA: Diagnosis not present

## 2018-09-15 DIAGNOSIS — K219 Gastro-esophageal reflux disease without esophagitis: Secondary | ICD-10-CM | POA: Diagnosis not present

## 2018-09-15 DIAGNOSIS — E785 Hyperlipidemia, unspecified: Secondary | ICD-10-CM | POA: Diagnosis not present

## 2018-09-15 LAB — CBC
HCT: 34.6 % — ABNORMAL LOW (ref 39.0–52.0)
Hemoglobin: 11.3 g/dL — ABNORMAL LOW (ref 13.0–17.0)
MCH: 29.8 pg (ref 26.0–34.0)
MCHC: 32.7 g/dL (ref 30.0–36.0)
MCV: 91.3 fL (ref 80.0–100.0)
NRBC: 0 % (ref 0.0–0.2)
Platelets: 230 10*3/uL (ref 150–400)
RBC: 3.79 MIL/uL — ABNORMAL LOW (ref 4.22–5.81)
RDW: 14.7 % (ref 11.5–15.5)
WBC: 15.1 10*3/uL — AB (ref 4.0–10.5)

## 2018-09-15 LAB — BASIC METABOLIC PANEL
ANION GAP: 8 (ref 5–15)
BUN: 20 mg/dL (ref 8–23)
CO2: 24 mmol/L (ref 22–32)
Calcium: 8.3 mg/dL — ABNORMAL LOW (ref 8.9–10.3)
Chloride: 104 mmol/L (ref 98–111)
Creatinine, Ser: 1 mg/dL (ref 0.61–1.24)
GFR calc Af Amer: >60 mL/min (ref >60)
GFR calc non Af Amer: >60 mL/min (ref >60)
Glucose, Bld: 132 mg/dL — ABNORMAL HIGH (ref 70–99)
POTASSIUM: 3.9 mmol/L (ref 3.5–5.1)
Sodium: 136 mmol/L (ref 135–145)

## 2018-09-15 NOTE — Progress Notes (Signed)
Physical Therapy Treatment Patient Details Name: Manuel Harper MRN: [id number] DOB: August 13, 1956 Today's Date: 09/15/2018    History of Present Illness Patient is a 63 y/o male s/p L TKA on 09/14/18. PMH significant for lumbar stenosis with prior back surgery.     PT Comments    Pt with a little more pain today during session. Limited with knee flexion exercises and a lot of coaching for relaxing during exercises and mobility.  Will continue to follow and wife and pt educated with home exercise program and knee safety /precautions.    Follow Up Recommendations  Follow surgeon's recommendation for DC plan and follow-up therapies     Equipment Recommendations  Rolling walker with 5" wheels;3in1 (PT)    Recommendations for Other Services       Precautions / Restrictions Precautions Precautions: Fall;Knee Precaution Comments: reviewed no pillow under knee Required Braces or Orthoses: Knee Immobilizer - Left Knee Immobilizer - Left: Discontinue once straight leg raise with < 10 degree lag Restrictions Weight Bearing Restrictions: Yes    Mobility  Bed Mobility Overal bed mobility: Needs Assistance Bed Mobility: Supine to Sit     Supine to sit: Min guard     General bed mobility comments: Min guard for Le today   Transfers Overall transfer level: Needs assistance Equipment used: Rolling walker (2 wheeled) Transfers: Sit to/from Stand Sit to Stand: Min guard         General transfer comment: cues for hand placement   Ambulation/Gait Ambulation/Gait assistance: Min guard Gait Distance (Feet): 100 Feet Assistive device: Rolling walker (2 wheeled) Gait Pattern/deviations: Step-to pattern;Decreased stride length;Decreased weight shift to left;Antalgic Gait velocity: decreased   General Gait Details: little more difficulty today due to increased pain , so educated with step to pattern to decrease the aggrevation to the knee    Stairs             Wheelchair  Mobility    Modified Rankin (Stroke Patients Only)       Balance Overall balance assessment: Mild deficits observed, not formally tested                                          Cognition Arousal/Alertness: Awake/alert Behavior During Therapy: WFL for tasks assessed/performed Overall Cognitive Status: Within Functional Limits for tasks assessed                                        Exercises Total Joint Exercises Ankle Circles/Pumps: AROM;15 reps;Both;Supine Quad Sets: Left;AROM;10 reps;Supine Heel Slides: Left;Supine;AAROM;10 reps Straight Leg Raises: AAROM;10 reps;Left;Supine Goniometric ROM: 0-45 very limited due to pain with knee flexion. Very tense worked a lot on relaxing and trying to not guard all muscles     General Comments        Pertinent Vitals/Pain Pain Score: 5  Pain Location: L knee Pain Descriptors / Indicators: Burning;Sore Pain Intervention(s): Monitored during session;Relaxation;Ice applied    Home Living Family/patient expects to be discharged to:: Private residence Living Arrangements: Spouse/significant other Available Help at Discharge: Family;Available 24 hours/day Type of Home: House Home Access: Ramped entrance   Home Layout: Two level;Able to live on main level with bedroom/bathroom Home Equipment: [name] - single point;Shower seat      Prior Function Level of Independence: Independent  PT Goals (current goals can now be found in the care plan section) Acute Rehab PT Goals Patient Stated Goal: regain strength and mobility PT Goal Formulation: With patient Time For Goal Achievement: 09/21/18 Potential to Achieve Goals: Good Progress towards PT goals: Progressing toward goals    Frequency    7X/week      PT Plan      Co-evaluation              AM-PAC PT "6 Clicks" Mobility   Outcome Measure  Help needed turning from your back to your side while in a flat bed without using  bedrails?: A Little Help needed moving from lying on your back to sitting on the side of a flat bed without using bedrails?: A Little Help needed moving to and from a bed to a chair (including a wheelchair)?: A Little Help needed standing up from a chair using your arms (e.g., wheelchair or bedside chair)?: A Little Help needed to walk in hospital room?: A Little Help needed climbing 3-5 steps with a railing? : A Lot 6 Click Score: 17    End of Session Equipment Utilized During Treatment: Gait belt Activity Tolerance: Patient tolerated treatment well Patient left: in chair;with call bell/phone within reach;with family/visitor present Nurse Communication: Mobility status PT Visit Diagnosis: Unsteadiness on feet (R26.81);Other abnormalities of gait and mobility (R26.89);Muscle weakness (generalized) (M62.81)     Time: 1281-1886 PT Time Calculation (min) (ACUTE ONLY): 69 min  Charges:  $Gait Training: 23-37 mins $Therapeutic Exercise: 8-22 mins $Therapeutic Activity: 23-37 mins                     Clide Dales, PT Acute Rehabilitation Services Pager: [phone] Office: [phone] 09/15/2018    Clide Dales 09/15/2018, 2:10 PM

## 2018-09-15 NOTE — Progress Notes (Signed)
   Subjective: 1 Day Post-Op Procedure(s) (LRB): TOTAL KNEE ARTHROPLASTY (Left)  Pt doing well Minimal pain in the left knee Denies any new symptoms or issues Therapy exercises went well yesterday Patient reports pain as mild.  Objective:   VITALS:   Vitals:   09/15/18 0103 09/15/18 0444  BP: 122/88 111/72  Pulse: 98 89  Resp: 14 14  Temp: 98 F (36.7 C) 98.2 F (36.8 C)  SpO2: 96% 97%    Left knee dressing and immobilizer in place nv intact distally Drain pulled No rashes   LABS Recent Labs    09/15/18 0439  HGB 11.3*  HCT 34.6*  WBC 15.1*  PLT 230    Recent Labs    09/15/18 0439  NA 136  K 3.9  BUN 20  CREATININE 1.00  GLUCOSE 132*     Assessment/Plan: 1 Day Post-Op Procedure(s) (LRB): TOTAL KNEE ARTHROPLASTY (Left) PT/OT today Pulmonary toilet Plan for d/c tomorrow     Merla Riches PA-C, [facility]  [place] [address] [place], [place], [place] [address] Phone: [phone] [email or web address] Facebook  [employer]

## 2018-09-15 NOTE — Progress Notes (Signed)
Physical Therapy Treatment Patient Details Name: Manuel Harper MRN: [id number] DOB: 1956-06-16 Today's Date: 09/15/2018    History of Present Illness Patient is a 63 y/o male s/p L TKA on 09/14/18. PMH significant for lumbar stenosis with prior back surgery.     PT Comments    Pt with improved ease with mobility with afternoon and pain under better control. Was able to ambulate and do exercise program, also worked on slow stretch for knee flexion due to knee flexion still really difficult. Will see tomorrow and anticipate discharge is all continues to go well.     Follow Up Recommendations  Follow surgeon's recommendation for DC plan and follow-up therapies     Equipment Recommendations  Rolling walker with 5" wheels;3in1 (PT)    Recommendations for Other Services       Precautions / Restrictions Precautions Precautions: Fall;Knee Precaution Comments: reviewed no pillow under knee Required Braces or Orthoses: Knee Immobilizer - Left Knee Immobilizer - Left: Discontinue once straight leg raise with < 10 degree lag Restrictions Weight Bearing Restrictions: Yes    Mobility  Bed Mobility Overal bed mobility: Needs Assistance Bed Mobility: Supine to Sit     Supine to sit: Supervision     General bed mobility comments: Min guard for Le today   Transfers Overall transfer level: Needs assistance Equipment used: Rolling walker (2 wheeled) Transfers: Sit to/from Stand Sit to Stand: Min guard         General transfer comment: cues for hand placement   Ambulation/Gait Ambulation/Gait assistance: Supervision Gait Distance (Feet): 120 Feet Assistive device: Rolling walker (2 wheeled) Gait Pattern/deviations: Step-to pattern;Decreased stride length;Decreased weight shift to left;Antalgic Gait velocity: decreased   General Gait Details: improved ease with gait this afternoon with less pain    Stairs             Wheelchair Mobility    Modified Rankin (Stroke  Patients Only)       Balance Overall balance assessment: Mild deficits observed, not formally tested                                          Cognition Arousal/Alertness: Awake/alert Behavior During Therapy: WFL for tasks assessed/performed Overall Cognitive Status: Within Functional Limits for tasks assessed                                        Exercises Total Joint Exercises Ankle Circles/Pumps: AROM;15 reps;Both;Supine Quad Sets: Left;AROM;10 reps;Supine Short Arc Quad: AAROM;10 reps;Left;Supine Heel Slides: Left;Supine;AAROM;10 reps Straight Leg Raises: AAROM;10 reps;Left;Supine Goniometric ROM: 0-50 a little more ease this afternoon     General Comments        Pertinent Vitals/Pain Pain Score: 2  Pain Location: L knee Pain Descriptors / Indicators: Burning;Sore Pain Intervention(s): Monitored during session;Ice applied    Home Living Family/patient expects to be discharged to:: Private residence Living Arrangements: Spouse/significant other Available Help at Discharge: Family;Available 24 hours/day Type of Home: House Home Access: Ramped entrance   Home Layout: Two level;Able to live on main level with bedroom/bathroom Home Equipment: [name] - single point;Shower seat      Prior Function Level of Independence: Independent          PT Goals (current goals can now be found in the care plan section) Acute  Rehab PT Goals Patient Stated Goal: regain strength and mobility PT Goal Formulation: With patient Time For Goal Achievement: 09/21/18 Potential to Achieve Goals: Good Progress towards PT goals: Progressing toward goals    Frequency    7X/week      PT Plan      Co-evaluation              AM-PAC PT "6 Clicks" Mobility   Outcome Measure  Help needed turning from your back to your side while in a flat bed without using bedrails?: A Little Help needed moving from lying on your back to sitting on the side  of a flat bed without using bedrails?: A Little Help needed moving to and from a bed to a chair (including a wheelchair)?: A Little Help needed standing up from a chair using your arms (e.g., wheelchair or bedside chair)?: A Little Help needed to walk in hospital room?: A Little Help needed climbing 3-5 steps with a railing? : A Lot 6 Click Score: 17    End of Session Equipment Utilized During Treatment: Gait belt Activity Tolerance: Patient tolerated treatment well Patient left: in chair;with call bell/phone within reach;with family/visitor present Nurse Communication: Mobility status PT Visit Diagnosis: Unsteadiness on feet (R26.81);Other abnormalities of gait and mobility (R26.89);Muscle weakness (generalized) (M62.81)     Time: 6283-6629 PT Time Calculation (min) (ACUTE ONLY): 43 min  Charges:  $Gait Training: 8-22 mins $Therapeutic Exercise: 23-37 mins $Therapeutic Activity: 23-37 mins                     Clide Dales, PT Acute Rehabilitation Services Pager: [phone] Office: [phone] 09/15/2018    Clide Dales 09/15/2018, 5:45 PM

## 2018-09-15 NOTE — Plan of Care (Signed)
  Problem: Health Behavior/Discharge Planning: Goal: Ability to manage health-related needs will improve Outcome: Progressing   Problem: Clinical Measurements: Goal: Ability to maintain clinical measurements within normal limits will improve Outcome: Progressing Goal: Will remain free from infection Outcome: Progressing Goal: Diagnostic test results will improve Outcome: Progressing Goal: Respiratory complications will improve Outcome: Progressing Goal: Cardiovascular complication will be avoided Outcome: Progressing   Problem: Activity: Goal: Risk for activity intolerance will decrease Outcome: Progressing   Problem: Nutrition: Goal: Adequate nutrition will be maintained Outcome: Progressing   Problem: Coping: Goal: Level of anxiety will decrease Outcome: Progressing   Problem: Elimination: Goal: Will not experience complications related to bowel motility Outcome: Progressing Goal: Will not experience complications related to urinary retention Outcome: Progressing   Problem: Pain Managment: Goal: General experience of comfort will improve Outcome: Progressing   Problem: Safety: Goal: Ability to remain free from injury will improve Outcome: Progressing   Problem: Education: Goal: Individualized Educational Video(s) Outcome: Progressing   Problem: Activity: Goal: Ability to avoid complications of mobility impairment will improve Outcome: Progressing Goal: Range of joint motion will improve Outcome: Progressing   Problem: Clinical Measurements: Goal: Postoperative complications will be avoided or minimized Outcome: Progressing   Problem: Pain Management: Goal: Pain level will decrease with appropriate interventions Outcome: Progressing

## 2018-09-16 DIAGNOSIS — M1712 Unilateral primary osteoarthritis, left knee: Secondary | ICD-10-CM | POA: Diagnosis not present

## 2018-09-16 LAB — CBC
HCT: 32.6 % — ABNORMAL LOW (ref 39.0–52.0)
HEMOGLOBIN: 10.7 g/dL — AB (ref 13.0–17.0)
MCH: 30.1 pg (ref 26.0–34.0)
MCHC: 32.8 g/dL (ref 30.0–36.0)
MCV: 91.6 fL (ref 80.0–100.0)
Platelets: 209 10*3/uL (ref 150–400)
RBC: 3.56 MIL/uL — ABNORMAL LOW (ref 4.22–5.81)
RDW: 14.9 % (ref 11.5–15.5)
WBC: 15.2 10*3/uL — ABNORMAL HIGH (ref 4.0–10.5)
nRBC: 0 % (ref 0.0–0.2)

## 2018-09-16 NOTE — Care Management Note (Signed)
Case Management Note  Patient Details  Name: TYWAUN HILTNER MRN: [id number] Date of Birth: Aug 04, 1956  Subjective/Objective:    S/p L TKA                Action/Plan: NCM spoke to pt and wife at bedside. And offered choice for [facility]. CMS list provided and placed on chart. Pt agreeable to [facility] for [facility]. RW and 3n1 bedside commode delivered to room prior to dc by [facility]. Contacted Alvis Lemmings with new referral.   Expected Discharge Date:  09/16/18               Expected Discharge Plan:  [place]  In-House Referral:  NA  Discharge planning Services  CM Consult  Post Acute Care Choice:  Home Health Choice offered to:  Patient  DME Arranged:  3-N-1, Walker rolling DME Agency:  [place]:  PT [place]:  Benjamin Perez  Status of Service:  Completed, signed off  If discussed at [place] of Stay Meetings, dates discussed:    Additional Comments:  Erenest Rasher, RN 09/16/2018, 1:22 PM

## 2018-09-16 NOTE — Progress Notes (Signed)
     Subjective: 2 Days Post-Op Procedure(s) (LRB): TOTAL KNEE ARTHROPLASTY (Left)   Patient reports pain as moderate, more pain than yesterday.  Feels that the block may have worn off.  Discussed ice and regular analgesic medication. If he does well with PT he can d/c home today.   Objective:   VITALS:   Vitals:   09/15/18 2129 09/16/18 0412  BP: (!) 154/89 125/86  Pulse: (!) 113 (!) 104  Resp: 15 14  Temp: 98.2 F (36.8 C) 98.6 F (37 C)  SpO2: 100% 95%    Dorsiflexion/Plantar flexion intact Incision: dressing C/D/I No cellulitis present Compartment soft  LABS Recent Labs    09/15/18 0439 09/16/18 0409  HGB 11.3* 10.7*  HCT 34.6* 32.6*  WBC 15.1* 15.2*  PLT 230 209    Recent Labs    09/15/18 0439  NA 136  K 3.9  BUN 20  CREATININE 1.00  GLUCOSE 132*     Assessment/Plan: 2 Days Post-Op Procedure(s) (LRB): TOTAL KNEE ARTHROPLASTY (Left)  ACE bandage removed Up with therapy Discharge home with home health Follow up in 2 weeks at [facility] Follow up with Dr Theda Sers in 2 weeks.  Contact information:  [facility]  [address] [name]   PAC  09/16/2018, 8:50 AM

## 2018-09-16 NOTE — Plan of Care (Signed)
  Problem: Health Behavior/Discharge Planning: Goal: Ability to manage health-related needs will improve Outcome: Progressing   Problem: Clinical Measurements: Goal: Ability to maintain clinical measurements within normal limits will improve Outcome: Progressing Goal: Will remain free from infection Outcome: Progressing Goal: Diagnostic test results will improve Outcome: Progressing Goal: Respiratory complications will improve Outcome: Progressing Goal: Cardiovascular complication will be avoided Outcome: Progressing   Problem: Activity: Goal: Risk for activity intolerance will decrease Outcome: Progressing   Problem: Nutrition: Goal: Adequate nutrition will be maintained Outcome: Progressing   Problem: Coping: Goal: Level of anxiety will decrease Outcome: Progressing   Problem: Elimination: Goal: Will not experience complications related to bowel motility Outcome: Progressing Goal: Will not experience complications related to urinary retention Outcome: Progressing   Problem: Pain Managment: Goal: General experience of comfort will improve Outcome: Progressing   Problem: Safety: Goal: Ability to remain free from injury will improve Outcome: Progressing   Problem: Education: Goal: Individualized Educational Video(s) Outcome: Progressing   Problem: Activity: Goal: Ability to avoid complications of mobility impairment will improve Outcome: Progressing Goal: Range of joint motion will improve Outcome: Progressing   Problem: Clinical Measurements: Goal: Postoperative complications will be avoided or minimized Outcome: Progressing   Problem: Pain Management: Goal: Pain level will decrease with appropriate interventions Outcome: Progressing

## 2018-09-16 NOTE — Progress Notes (Signed)
Physical Therapy Treatment Patient Details Name: Manuel Harper MRN: [id number] DOB: [birth year] Today's Date: 09/16/2018    History of Present Illness Patient is a 63 y/o male s/p L TKA on 09/14/18. PMH significant for lumbar stenosis with prior back surgery.     PT Comments    Pt still with a lot of pain this morning, however once session started the LE seemed to feel pt per pt report. Reveiewed and perfromed all HEP including kne flexion, ice therapy, in out of car, and gait . All with progressing and wife educated as well. Ready for DC from PT standpoint.    Follow Up Recommendations  Follow surgeon's recommendation for DC plan and follow-up therapies     Equipment Recommendations  Rolling walker with 5" wheels;3in1 (PT)    Recommendations for Other Services       Precautions / Restrictions Precautions Precautions: Fall;Knee Precaution Comments: reviewed no pillow under knee Required Braces or Orthoses: (DCd KI due to [place] UP ) Restrictions Weight Bearing Restrictions: Yes    Mobility  Bed Mobility Overal bed mobility: Needs Assistance Bed Mobility: Supine to Sit     Supine to sit: Supervision     General bed mobility comments: use other leg or gait belt to assist LLE in and out of bed   Transfers Overall transfer level: Needs assistance Equipment used: Rolling walker (2 wheeled) Transfers: Sit to/from Stand Sit to Stand: Supervision            Ambulation/Gait Ambulation/Gait assistance: Supervision Gait Distance (Feet): 160 Feet Assistive device: Rolling walker (2 wheeled) Gait Pattern/deviations: Step-to pattern;Decreased stride length;Decreased weight shift to left;Antalgic     General Gait Details: gait improved as distance improved. Initally very painful and stiff.    Stairs             Wheelchair Mobility    Modified Rankin (Stroke Patients Only)       Balance                                             Cognition Arousal/Alertness: Awake/alert Behavior During Therapy: WFL for tasks assessed/performed Overall Cognitive Status: Within Functional Limits for tasks assessed                                        Exercises Total Joint Exercises Ankle Circles/Pumps: AROM;15 reps;Both;Supine Quad Sets: Left;AROM;10 reps;Supine Short Arc Quad: AAROM;10 reps;Left;Supine Heel Slides: Left;Supine;AAROM;10 reps Straight Leg Raises: AAROM;10 reps;Left;Supine Knee Flexion: AAROM;Left;Seated Goniometric ROM: 0-60 seated with knee flexion stretches     General Comments        Pertinent Vitals/Pain Pain Score: 5  Pain Location: L knee (had more pain last night and this morning. After the session felt a little better, but still hurting quite a bit ) Pain Descriptors / Indicators: Sore Pain Intervention(s): Monitored during session;Ice applied    Home Living                      Prior Function            PT Goals (current goals can now be found in the care plan section) Acute Rehab PT Goals Patient Stated Goal: regain strength and mobility PT Goal Formulation: With patient Time  For Goal Achievement: 09/21/18 Potential to Achieve Goals: Good Progress towards PT goals: Progressing toward goals    Frequency    7X/week      PT Plan      Co-evaluation              AM-PAC PT "6 Clicks" Mobility   Outcome Measure  Help needed turning from your back to your side while in a flat bed without using bedrails?: A Little Help needed moving from lying on your back to sitting on the side of a flat bed without using bedrails?: A Little Help needed moving to and from a bed to a chair (including a wheelchair)?: A Little Help needed standing up from a chair using your arms (e.g., wheelchair or bedside chair)?: A Little Help needed to walk in hospital room?: A Little Help needed climbing 3-5 steps with a railing? : A Lot 6 Click Score: 17     End of Session Equipment Utilized During Treatment: Gait belt Activity Tolerance: Patient tolerated treatment well Patient left: in chair;with call bell/phone within reach;with family/visitor present Nurse Communication: Mobility status PT Visit Diagnosis: Unsteadiness on feet (R26.81);Other abnormalities of gait and mobility (R26.89);Muscle weakness (generalized) (M62.81)     Time: 5631-4970 PT Time Calculation (min) (ACUTE ONLY): 60 min  Charges:  $Gait Training: 23-37 mins $Therapeutic Exercise: 8-22 mins $Therapeutic Activity: 8-22 mins                     Clide Dales, PT Acute Rehabilitation Services Pager: [phone] Office: [phone] 09/16/2018    Clide Dales 09/16/2018, 12:41 PM

## 2018-09-17 ENCOUNTER — Encounter (HOSPITAL_COMMUNITY): Payer: Self-pay | Admitting: Specialist

## 2018-09-18 DIAGNOSIS — K219 Gastro-esophageal reflux disease without esophagitis: Secondary | ICD-10-CM | POA: Diagnosis not present

## 2018-09-18 DIAGNOSIS — Z471 Aftercare following joint replacement surgery: Secondary | ICD-10-CM | POA: Diagnosis not present

## 2018-09-18 DIAGNOSIS — M48062 Spinal stenosis, lumbar region with neurogenic claudication: Secondary | ICD-10-CM | POA: Diagnosis not present

## 2018-09-21 DIAGNOSIS — M48062 Spinal stenosis, lumbar region with neurogenic claudication: Secondary | ICD-10-CM | POA: Diagnosis not present

## 2018-09-21 DIAGNOSIS — Z471 Aftercare following joint replacement surgery: Secondary | ICD-10-CM | POA: Diagnosis not present

## 2018-09-21 DIAGNOSIS — K219 Gastro-esophageal reflux disease without esophagitis: Secondary | ICD-10-CM | POA: Diagnosis not present

## 2018-09-24 DIAGNOSIS — Z471 Aftercare following joint replacement surgery: Secondary | ICD-10-CM | POA: Diagnosis not present

## 2018-09-24 DIAGNOSIS — M48062 Spinal stenosis, lumbar region with neurogenic claudication: Secondary | ICD-10-CM | POA: Diagnosis not present

## 2018-09-24 DIAGNOSIS — K219 Gastro-esophageal reflux disease without esophagitis: Secondary | ICD-10-CM | POA: Diagnosis not present

## 2018-09-26 DIAGNOSIS — M48062 Spinal stenosis, lumbar region with neurogenic claudication: Secondary | ICD-10-CM | POA: Diagnosis not present

## 2018-09-26 DIAGNOSIS — Z471 Aftercare following joint replacement surgery: Secondary | ICD-10-CM | POA: Diagnosis not present

## 2018-09-26 DIAGNOSIS — K219 Gastro-esophageal reflux disease without esophagitis: Secondary | ICD-10-CM | POA: Diagnosis not present

## 2018-09-27 DIAGNOSIS — K219 Gastro-esophageal reflux disease without esophagitis: Secondary | ICD-10-CM | POA: Diagnosis not present

## 2018-09-27 DIAGNOSIS — Z471 Aftercare following joint replacement surgery: Secondary | ICD-10-CM | POA: Diagnosis not present

## 2018-09-27 DIAGNOSIS — M48062 Spinal stenosis, lumbar region with neurogenic claudication: Secondary | ICD-10-CM | POA: Diagnosis not present

## 2018-09-28 DIAGNOSIS — Z96652 Presence of left artificial knee joint: Secondary | ICD-10-CM | POA: Diagnosis not present

## 2018-09-28 DIAGNOSIS — Z471 Aftercare following joint replacement surgery: Secondary | ICD-10-CM | POA: Diagnosis not present

## 2018-10-01 DIAGNOSIS — M48062 Spinal stenosis, lumbar region with neurogenic claudication: Secondary | ICD-10-CM | POA: Diagnosis not present

## 2018-10-01 DIAGNOSIS — Z471 Aftercare following joint replacement surgery: Secondary | ICD-10-CM | POA: Diagnosis not present

## 2018-10-01 DIAGNOSIS — K219 Gastro-esophageal reflux disease without esophagitis: Secondary | ICD-10-CM | POA: Diagnosis not present

## 2018-10-03 DIAGNOSIS — Z471 Aftercare following joint replacement surgery: Secondary | ICD-10-CM | POA: Diagnosis not present

## 2018-10-03 DIAGNOSIS — K219 Gastro-esophageal reflux disease without esophagitis: Secondary | ICD-10-CM | POA: Diagnosis not present

## 2018-10-03 DIAGNOSIS — M48062 Spinal stenosis, lumbar region with neurogenic claudication: Secondary | ICD-10-CM | POA: Diagnosis not present

## 2018-10-05 DIAGNOSIS — M48062 Spinal stenosis, lumbar region with neurogenic claudication: Secondary | ICD-10-CM | POA: Diagnosis not present

## 2018-10-05 DIAGNOSIS — K219 Gastro-esophageal reflux disease without esophagitis: Secondary | ICD-10-CM | POA: Diagnosis not present

## 2018-10-05 DIAGNOSIS — Z471 Aftercare following joint replacement surgery: Secondary | ICD-10-CM | POA: Diagnosis not present

## 2018-10-08 DIAGNOSIS — M48062 Spinal stenosis, lumbar region with neurogenic claudication: Secondary | ICD-10-CM | POA: Diagnosis not present

## 2018-10-08 DIAGNOSIS — K219 Gastro-esophageal reflux disease without esophagitis: Secondary | ICD-10-CM | POA: Diagnosis not present

## 2018-10-08 DIAGNOSIS — Z471 Aftercare following joint replacement surgery: Secondary | ICD-10-CM | POA: Diagnosis not present

## 2018-10-09 DIAGNOSIS — M25662 Stiffness of left knee, not elsewhere classified: Secondary | ICD-10-CM | POA: Diagnosis not present

## 2018-10-11 DIAGNOSIS — M25662 Stiffness of left knee, not elsewhere classified: Secondary | ICD-10-CM | POA: Diagnosis not present

## 2018-10-12 DIAGNOSIS — M25669 Stiffness of unspecified knee, not elsewhere classified: Secondary | ICD-10-CM | POA: Diagnosis not present

## 2018-10-15 DIAGNOSIS — M25669 Stiffness of unspecified knee, not elsewhere classified: Secondary | ICD-10-CM | POA: Diagnosis not present

## 2018-10-17 DIAGNOSIS — M25669 Stiffness of unspecified knee, not elsewhere classified: Secondary | ICD-10-CM | POA: Diagnosis not present

## 2018-10-19 DIAGNOSIS — M25662 Stiffness of left knee, not elsewhere classified: Secondary | ICD-10-CM | POA: Diagnosis not present

## 2018-10-29 DIAGNOSIS — M25669 Stiffness of unspecified knee, not elsewhere classified: Secondary | ICD-10-CM | POA: Diagnosis not present

## 2018-10-31 DIAGNOSIS — M25662 Stiffness of left knee, not elsewhere classified: Secondary | ICD-10-CM | POA: Diagnosis not present

## 2018-11-05 DIAGNOSIS — M25669 Stiffness of unspecified knee, not elsewhere classified: Secondary | ICD-10-CM | POA: Diagnosis not present

## 2018-11-07 DIAGNOSIS — M25669 Stiffness of unspecified knee, not elsewhere classified: Secondary | ICD-10-CM | POA: Diagnosis not present

## 2018-11-14 DIAGNOSIS — M25669 Stiffness of unspecified knee, not elsewhere classified: Secondary | ICD-10-CM | POA: Diagnosis not present

## 2018-12-10 DIAGNOSIS — Z471 Aftercare following joint replacement surgery: Secondary | ICD-10-CM | POA: Diagnosis not present

## 2018-12-10 DIAGNOSIS — Z96652 Presence of left artificial knee joint: Secondary | ICD-10-CM | POA: Diagnosis not present

## 2018-12-10 DIAGNOSIS — M1712 Unilateral primary osteoarthritis, left knee: Secondary | ICD-10-CM | POA: Diagnosis not present

## 2019-02-26 ENCOUNTER — Other Ambulatory Visit: Payer: Self-pay | Admitting: *Deleted

## 2019-02-26 DIAGNOSIS — Z20822 Contact with and (suspected) exposure to covid-19: Secondary | ICD-10-CM

## 2019-03-03 LAB — NOVEL CORONAVIRUS, NAA: SARS-CoV-2, NAA: NOT DETECTED

## 2019-05-16 DIAGNOSIS — E7800 Pure hypercholesterolemia, unspecified: Secondary | ICD-10-CM | POA: Diagnosis not present

## 2019-05-16 DIAGNOSIS — Z79899 Other long term (current) drug therapy: Secondary | ICD-10-CM | POA: Diagnosis not present

## 2019-05-16 DIAGNOSIS — Z125 Encounter for screening for malignant neoplasm of prostate: Secondary | ICD-10-CM | POA: Diagnosis not present

## 2019-05-16 DIAGNOSIS — Z23 Encounter for immunization: Secondary | ICD-10-CM | POA: Diagnosis not present

## 2019-07-10 DIAGNOSIS — M1711 Unilateral primary osteoarthritis, right knee: Secondary | ICD-10-CM | POA: Diagnosis not present

## 2019-07-10 DIAGNOSIS — Z471 Aftercare following joint replacement surgery: Secondary | ICD-10-CM | POA: Diagnosis not present

## 2019-07-10 DIAGNOSIS — Z96652 Presence of left artificial knee joint: Secondary | ICD-10-CM | POA: Diagnosis not present

## 2019-07-22 DIAGNOSIS — M25532 Pain in left wrist: Secondary | ICD-10-CM | POA: Diagnosis not present

## 2019-07-22 DIAGNOSIS — M19032 Primary osteoarthritis, left wrist: Secondary | ICD-10-CM | POA: Diagnosis not present

## 2019-07-22 DIAGNOSIS — M25531 Pain in right wrist: Secondary | ICD-10-CM | POA: Diagnosis not present

## 2019-09-17 DIAGNOSIS — L111 Transient acantholytic dermatosis [Grover]: Secondary | ICD-10-CM | POA: Diagnosis not present

## 2019-09-17 DIAGNOSIS — L821 Other seborrheic keratosis: Secondary | ICD-10-CM | POA: Diagnosis not present

## 2019-10-18 ENCOUNTER — Ambulatory Visit: Payer: Self-pay | Attending: Internal Medicine

## 2019-10-18 ENCOUNTER — Other Ambulatory Visit: Payer: Self-pay

## 2019-10-18 DIAGNOSIS — Z23 Encounter for immunization: Secondary | ICD-10-CM

## 2019-10-18 NOTE — Progress Notes (Signed)
   Covid-19 Vaccination Clinic  Name:  Manuel Harper    MRN: [id number] DOB: [birth year]  10/18/2019  Manuel Harper was observed post Covid-19 immunization for 15 minutes without incident. He was provided with Vaccine Information Sheet and instruction to access the V-Safe system.   Manuel Harper was instructed to call 911 with any severe reactions post vaccine: Marland Kitchen Difficulty breathing  . Swelling of face and throat  . A fast heartbeat  . A bad rash all over body  . Dizziness and weakness   Immunizations Administered    Name Date Dose VIS Date Route   Pfizer COVID-19 Vaccine 10/18/2019  4:34 PM 0.3 mL 07/26/2019 Intramuscular   Manufacturer: [place]   Lot: [id number]   [place]: [id number]

## 2019-11-09 ENCOUNTER — Ambulatory Visit: Payer: Self-pay | Attending: Internal Medicine

## 2019-11-09 DIAGNOSIS — Z23 Encounter for immunization: Secondary | ICD-10-CM

## 2019-11-09 NOTE — Progress Notes (Signed)
   Covid-19 Vaccination Clinic  Name:  JAMESLEY WENMAN    MRN: [id number] DOB: [birth year]  11/09/2019  Mr. Kilpela was observed post Covid-19 immunization for 15 minutes without incident. He was provided with Vaccine Information Sheet and instruction to access the V-Safe system.   Mr. Boring was instructed to call 911 with any severe reactions post vaccine: Marland Kitchen Difficulty breathing  . Swelling of face and throat  . A fast heartbeat  . A bad rash all over body  . Dizziness and weakness   Immunizations Administered    Name Date Dose VIS Date Route   Pfizer COVID-19 Vaccine 11/09/2019  2:42 PM 0.3 mL 07/26/2019 Intramuscular   Manufacturer: [employer]   Lot: [id number]   [place]: [id number]

## 2019-11-19 ENCOUNTER — Ambulatory Visit: Payer: Self-pay

## 2019-11-20 DIAGNOSIS — M791 Myalgia, unspecified site: Secondary | ICD-10-CM | POA: Diagnosis not present

## 2019-12-03 DIAGNOSIS — L821 Other seborrheic keratosis: Secondary | ICD-10-CM | POA: Diagnosis not present

## 2019-12-03 DIAGNOSIS — D692 Other nonthrombocytopenic purpura: Secondary | ICD-10-CM | POA: Diagnosis not present

## 2019-12-03 DIAGNOSIS — L237 Allergic contact dermatitis due to plants, except food: Secondary | ICD-10-CM | POA: Diagnosis not present

## 2019-12-03 DIAGNOSIS — Z85828 Personal history of other malignant neoplasm of skin: Secondary | ICD-10-CM | POA: Diagnosis not present

## 2019-12-23 DIAGNOSIS — Z96652 Presence of left artificial knee joint: Secondary | ICD-10-CM | POA: Diagnosis not present

## 2019-12-23 DIAGNOSIS — M1711 Unilateral primary osteoarthritis, right knee: Secondary | ICD-10-CM | POA: Diagnosis not present

## 2020-01-07 DIAGNOSIS — R05 Cough: Secondary | ICD-10-CM | POA: Diagnosis not present

## 2020-01-08 ENCOUNTER — Other Ambulatory Visit: Payer: Self-pay | Admitting: Family Medicine

## 2020-01-08 ENCOUNTER — Ambulatory Visit
Admission: RE | Admit: 2020-01-08 | Discharge: 2020-01-08 | Disposition: A | Discharge status: Home / Self Care | Payer: Self-pay | Source: Ambulatory Visit | Attending: Family Medicine | Admitting: Family Medicine

## 2020-01-08 DIAGNOSIS — R059 Cough, unspecified: Secondary | ICD-10-CM

## 2020-02-03 DIAGNOSIS — M25531 Pain in right wrist: Secondary | ICD-10-CM | POA: Diagnosis not present

## 2020-02-03 DIAGNOSIS — M19031 Primary osteoarthritis, right wrist: Secondary | ICD-10-CM | POA: Diagnosis not present

## 2020-02-03 DIAGNOSIS — M25532 Pain in left wrist: Secondary | ICD-10-CM | POA: Diagnosis not present

## 2020-02-03 DIAGNOSIS — M13841 Other specified arthritis, right hand: Secondary | ICD-10-CM | POA: Diagnosis not present

## 2020-04-02 DIAGNOSIS — M25532 Pain in left wrist: Secondary | ICD-10-CM | POA: Diagnosis not present

## 2020-04-02 DIAGNOSIS — G5602 Carpal tunnel syndrome, left upper limb: Secondary | ICD-10-CM | POA: Diagnosis not present

## 2020-04-08 DIAGNOSIS — Z8719 Personal history of other diseases of the digestive system: Secondary | ICD-10-CM | POA: Diagnosis not present

## 2020-04-08 DIAGNOSIS — R195 Other fecal abnormalities: Secondary | ICD-10-CM | POA: Diagnosis not present

## 2020-04-08 DIAGNOSIS — K219 Gastro-esophageal reflux disease without esophagitis: Secondary | ICD-10-CM | POA: Diagnosis not present

## 2020-04-08 DIAGNOSIS — Z86010 Personal history of colonic polyps: Secondary | ICD-10-CM | POA: Diagnosis not present

## 2020-05-15 DIAGNOSIS — Z79899 Other long term (current) drug therapy: Secondary | ICD-10-CM | POA: Diagnosis not present

## 2020-05-15 DIAGNOSIS — Z0000 Encounter for general adult medical examination without abnormal findings: Secondary | ICD-10-CM | POA: Diagnosis not present

## 2020-05-15 DIAGNOSIS — E7800 Pure hypercholesterolemia, unspecified: Secondary | ICD-10-CM | POA: Diagnosis not present

## 2020-05-15 DIAGNOSIS — Z23 Encounter for immunization: Secondary | ICD-10-CM | POA: Diagnosis not present

## 2020-05-15 DIAGNOSIS — Z125 Encounter for screening for malignant neoplasm of prostate: Secondary | ICD-10-CM | POA: Diagnosis not present

## 2020-08-10 DIAGNOSIS — Z03818 Encounter for observation for suspected exposure to other biological agents ruled out: Secondary | ICD-10-CM | POA: Diagnosis not present

## 2020-09-14 DIAGNOSIS — R0989 Other specified symptoms and signs involving the circulatory and respiratory systems: Secondary | ICD-10-CM | POA: Diagnosis not present

## 2020-09-22 ENCOUNTER — Ambulatory Visit
Admission: EM | Admit: 2020-09-22 | Discharge: 2020-09-22 | Disposition: A | Discharge status: Home / Self Care | Payer: BC Managed Care – PPO | Attending: Internal Medicine | Admitting: Internal Medicine

## 2020-09-22 ENCOUNTER — Other Ambulatory Visit: Payer: Self-pay

## 2020-09-22 ENCOUNTER — Ambulatory Visit (INDEPENDENT_AMBULATORY_CARE_PROVIDER_SITE_OTHER): Payer: BC Managed Care – PPO

## 2020-09-22 DIAGNOSIS — R059 Cough, unspecified: Secondary | ICD-10-CM | POA: Diagnosis not present

## 2020-09-22 MED ORDER — DOXYCYCLINE HYCLATE 100 MG PO CAPS: 100.0000 mg | ORAL_CAPSULE | Freq: Two times a day (BID) | ORAL | 0 refills | Status: DC

## 2020-09-22 NOTE — ED Provider Notes (Signed)
[facility]    CSN: [id number] Arrival date & time: 09/22/20  0800      History   Chief Complaint Chief Complaint  Patient presents with  . Chest congestion    X 1 month    HPI Manuel Harper is a 65 y.o. male who has had cough which is mostlyh non productive since Christmas. Has not has fever chills or night sweats. Has mild PND but no rhinitis. Denies CP or SOB. On occasion when he gets up feels numbness across upper chest x 4-5 weeks. His cough started when his son was diagnosed with Covid( not Psychologist, clinical and has no kids) and every one that was there was positive for covid except him. His cough is not getting worse. At times has productive cough with yellow mucous. Denies edema of legs. Has had 4 negative covid test since.    Past Medical History:  Diagnosis Date  . Anemia    hx of acute loss from nose bleed put on iron for a while  . Arthritis   . GERD (gastroesophageal reflux disease)   . Hx of cold sores   . Hyperlipidemia     Patient Active Problem List   Diagnosis Date Noted  . S/P knee replacement 09/14/2018  . 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 ANTERIOR CRUCIATE LIGAMENT REPAIR Bilateral   . KNEE ARTHROSCOPY Bilateral    over yrs  . LUMBAR LAMINECTOMY/DECOMPRESSION MICRODISCECTOMY Bilateral 09/08/2014   Procedure: LUMBAR LAMINECTOMY/DECOMPRESSION MICRODISCECTOMY 1 LEVEL;  Surgeon: Hosie Spangle, MD;  Location: [place] [facility];  Service: Neurosurgery;  Laterality: Bilateral;  bilateral L45 lumbar laminotomy and foraminotomy  . SPINAL FUSION    . TOTAL KNEE ARTHROPLASTY Left 09/14/2018   Procedure: TOTAL KNEE ARTHROPLASTY;  Surgeon: Sydnee Cabal, MD;  Location: [facility];  Service: Orthopedics;  Laterality: Left;       Home Medications    Prior to Admission medications   Medication Sig Start Date End Date Taking? Authorizing Provider   cyclobenzaprine (FLEXERIL) 10 MG tablet Take 10 mg by mouth 3 (three) times daily as needed for muscle spasms.   Yes [provider]  diphenhydramine-acetaminophen (TYLENOL PM) 25-500 MG TABS tablet Take 1 tablet by mouth at bedtime as needed (sleep).    Yes [provider]  lovastatin (MEVACOR) 40 MG tablet Take 40 mg by mouth daily.  06/22/14  Yes [provider]  MELATONIN PO Take 5 mg by mouth at bedtime.   Yes [provider]  Multiple Vitamin (MULTIVITAMIN WITH MINERALS) TABS tablet Take 1 tablet by mouth daily.   Yes [provider]  OVER THE COUNTER MEDICATION Take 20 mg by mouth daily. CBD oil  1 dropper = 20mg    Yes [provider]  pantoprazole (PROTONIX) 40 MG tablet Take 40 mg by mouth daily.    Yes [provider]  cephALEXin (KEFLEX) 500 MG capsule Take 1 capsule (500 mg total) by mouth 4 (four) times daily. Patient not taking: No sig reported 03/13/18   Horton, Barbette Hair, MD  TURMERIC PO Take 1,000 mg by mouth daily.     [provider]  valACYclovir (VALTREX) 1000 MG tablet Take 1,000 mg by mouth 2 (two) times daily as needed (cold sores). For 2 days    [provider]    Family History Family History  Problem Relation Age of Onset  . Depression Mother   .  Lung cancer Father   . Alcoholism Brother     Social History Social History   Tobacco Use  . Smoking status: Current Some Day Smoker    Types: Cigars  . Smokeless tobacco: Never Used  . Tobacco comment: once a year  Vaping Use  . Vaping Use: Never used  Substance Use Topics  . Alcohol use: Yes    Alcohol/week: 10.0 standard drinks    Types: 5 Glasses of wine, 5 Cans of beer per week    Comment: 5-6 per week  . Drug use: No    Comment: occ     Allergies   Patient has no known allergies.   Review of Systems Review of Systems  Constitutional: Negative for appetite change, chills, diaphoresis, fatigue and fever.  HENT:  Positive for postnasal drip. Negative for congestion, ear discharge, ear pain, rhinorrhea, sinus pressure, sinus pain, sore throat and trouble swallowing.   Eyes: Negative for discharge.  Respiratory: Positive for cough. Negative for chest tightness, shortness of breath and wheezing.   Cardiovascular: Negative for chest pain.  Gastrointestinal:       Has hx of GERD but is under control with current medication  Musculoskeletal: Negative for gait problem and myalgias.  Skin: Negative for rash.  Neurological: Negative for headaches.  Hematological: Negative for adenopathy.     Physical Exam Triage Vital Signs ED Triage Vitals  Enc Vitals Group     BP 09/22/20 0821 117/83     Pulse Rate 09/22/20 0821 80     Resp 09/22/20 0821 18     Temp 09/22/20 0821 98.3 F (36.8 C)     Temp Source 09/22/20 0821 Oral     SpO2 09/22/20 0821 97 %     Weight --      Height --      Head Circumference --      Peak Flow --      Pain Score 09/22/20 0823 0     Pain Loc --      Pain Edu? --      Excl. in Register? --    No data found.  Updated Vital Signs BP 117/83 (BP Location: Left Arm)   Pulse 80   Temp 98.3 F (36.8 C) (Oral)   Resp 18   SpO2 97%   Visual Acuity Right Eye Distance:   Left Eye Distance:   Bilateral Distance:    Right Eye Near:   Left Eye Near:    Bilateral Near:     Physical Exam Physical Exam Vitals signs and nursing note reviewed.  Constitutional:      General: She is not in acute distress.    Appearance: Normal appearance. She is not ill-appearing, toxic-appearing or diaphoretic.  HENT:     Head: Normocephalic.     Right Ear: Tympanic membrane, ear canal and external ear normal.     Left Ear: Tympanic membrane, ear canal and external ear normal.     Nose: Nose normal.     Mouth/Throat:     Mouth: Mucous membranes are moist. Sinuses are not tender.  Eyes:     General: No scleral icterus.       Right eye: No discharge.        Left eye: No discharge.      Conjunctiva/sclera: Conjunctivae normal.  Neck:     Musculoskeletal: Neck supple. No neck rigidity.  Cardiovascular:     Rate and Rhythm: Normal rate and regular rhythm.     Heart sounds:  No murmur.  Pulmonary:     Effort: Pulmonary effort is normal.     Breath sounds: Normal breath sounds.   Musculoskeletal: Normal range of motion.  Lymphadenopathy:     Cervical: No cervical adenopathy.  Skin:    General: Skin is warm and dry.     Coloration: Skin is not jaundiced.     Findings: No rash.  Neurological:     Mental Status: She is alert and oriented to person, place, and time.     Gait: Gait normal.  Psychiatric:        Mood and Affect: Mood normal.        Behavior: Behavior normal.        Thought Content: Thought content normal.        Judgment: Judgment normal.   UC Treatments / Results  Labs (all labs ordered are listed, but only abnormal results are displayed) Labs Reviewed - No data to display  EKG   Radiology No results found.  Procedures Procedures (including critical care time)  Medications Ordered in UC Medications - No data to display  Initial Impression / Assessment and Plan / UC Course  I have reviewed the triage vital signs and the nursing notes.  Pertinent  imaging results that were available during my care of the patient were reviewed by me and considered in my medical decision making (see chart for details).  Final Clinical Impressions(s) / UC Diagnoses   Final diagnoses:  None   Discharge Instructions   None    ED Prescriptions    None     PDMP not reviewed this encounter.   Shelby Mattocks, [place] 09/22/20 [phone]

## 2020-09-22 NOTE — ED Triage Notes (Signed)
Patient states he has had congestion for about 2 months in varying degrees over the last 2 months. Pt is aox4 and ambulatory.

## 2020-09-22 NOTE — Discharge Instructions (Addendum)
I am placing you on an antibiotic to see if this will help get rid of the cough. 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 If it continues, please make sure to follow up with your primary care doctor.  Your chest xray was normal

## 2020-10-19 DIAGNOSIS — M19032 Primary osteoarthritis, left wrist: Secondary | ICD-10-CM | POA: Diagnosis not present

## 2020-10-19 DIAGNOSIS — G5602 Carpal tunnel syndrome, left upper limb: Secondary | ICD-10-CM | POA: Diagnosis not present

## 2020-10-20 DIAGNOSIS — R030 Elevated blood-pressure reading, without diagnosis of hypertension: Secondary | ICD-10-CM | POA: Diagnosis not present

## 2020-10-20 DIAGNOSIS — Z9889 Other specified postprocedural states: Secondary | ICD-10-CM | POA: Diagnosis not present

## 2020-10-20 DIAGNOSIS — M5416 Radiculopathy, lumbar region: Secondary | ICD-10-CM | POA: Diagnosis not present

## 2020-10-20 DIAGNOSIS — M4726 Other spondylosis with radiculopathy, lumbar region: Secondary | ICD-10-CM | POA: Diagnosis not present

## 2020-10-21 ENCOUNTER — Other Ambulatory Visit: Payer: Self-pay | Admitting: Pain Medicine

## 2020-10-21 DIAGNOSIS — M5416 Radiculopathy, lumbar region: Secondary | ICD-10-CM

## 2020-10-23 DIAGNOSIS — M1711 Unilateral primary osteoarthritis, right knee: Secondary | ICD-10-CM | POA: Diagnosis not present

## 2020-10-23 DIAGNOSIS — Z96652 Presence of left artificial knee joint: Secondary | ICD-10-CM | POA: Diagnosis not present

## 2020-10-28 DIAGNOSIS — Z8719 Personal history of other diseases of the digestive system: Secondary | ICD-10-CM | POA: Diagnosis not present

## 2020-10-28 DIAGNOSIS — Z86010 Personal history of colonic polyps: Secondary | ICD-10-CM | POA: Diagnosis not present

## 2020-10-28 DIAGNOSIS — K219 Gastro-esophageal reflux disease without esophagitis: Secondary | ICD-10-CM | POA: Diagnosis not present

## 2020-11-17 ENCOUNTER — Ambulatory Visit
Admission: EM | Admit: 2020-11-17 | Discharge: 2020-11-17 | Disposition: A | Discharge status: Home / Self Care | Payer: BC Managed Care – PPO | Attending: Student | Admitting: Student

## 2020-11-17 ENCOUNTER — Other Ambulatory Visit: Payer: Self-pay

## 2020-11-17 DIAGNOSIS — J301 Allergic rhinitis due to pollen: Secondary | ICD-10-CM | POA: Diagnosis not present

## 2020-11-17 MED ORDER — MONTELUKAST SODIUM 10 MG PO TABS: 10.0000 mg | ORAL_TABLET | Freq: Every day | ORAL | 2 refills | Status: DC

## 2020-11-17 MED ORDER — CETIRIZINE HCL 10 MG PO TABS: 10.0000 mg | ORAL_TABLET | Freq: Every day | ORAL | 2 refills | Status: DC

## 2020-11-17 NOTE — Discharge Instructions (Signed)
-  Try Zyrtec (cetirizine) for 1 week.  Take this daily.  If your symptoms persist, stop Zyrtec and then try Singulair (montelukast) for 1 week. -Also continue the nasal steroid that you were using. -If your symptoms persist, follow-up with ear nose and throat provider -If your symptoms worsen like facial pressure, consistently bloody mucus, fever/chills-come back and see [place].  May be time for antibiotic.

## 2020-11-17 NOTE — ED Provider Notes (Signed)
[facility]    CSN: [id number] Arrival date & time: 11/17/20  0830      History   Chief Complaint Chief Complaint  Patient presents with  . Nasal Congestion    HPI Manuel Harper is a 65 y.o. male.  Presenting with 1 month of nasal congestion and postnasal drip. History GERD, hyperlipidemia.  Has tried nasal spray though he is not sure of the name, thinks this is a nasal steroid.  Denies URI symptoms like cough, fever/chills, nausea/vomiting/diarrhea, sore throat.  Denies sinus pressure, bloody sputum, fever/chills, ear pain, hearing changes.   HPI  Past Medical History:  Diagnosis Date  . Anemia    hx of acute loss from nose bleed put on iron for a while  . Arthritis   . GERD (gastroesophageal reflux disease)   . Hx of cold sores   . Hyperlipidemia     Patient Active Problem List   Diagnosis Date Noted  . S/P knee replacement 09/14/2018  . 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 ANTERIOR CRUCIATE LIGAMENT REPAIR Bilateral   . KNEE ARTHROSCOPY Bilateral    over yrs  . LUMBAR LAMINECTOMY/DECOMPRESSION MICRODISCECTOMY Bilateral 09/08/2014   Procedure: LUMBAR LAMINECTOMY/DECOMPRESSION MICRODISCECTOMY 1 LEVEL;  Surgeon: Hosie Spangle, MD;  Location: [facility];  Service: Neurosurgery;  Laterality: Bilateral;  bilateral L45 lumbar laminotomy and foraminotomy  . SPINAL FUSION    . TOTAL KNEE ARTHROPLASTY Left 09/14/2018   Procedure: TOTAL KNEE ARTHROPLASTY;  Surgeon: Sydnee Cabal, MD;  Location: [facility];  Service: Orthopedics;  Laterality: Left;       Home Medications    Prior to Admission medications   Medication Sig Start Date End Date Taking? Authorizing Provider  cetirizine (ZYRTEC ALLERGY) 10 MG tablet Take 1 tablet (10 mg total) by mouth daily. 11/17/20  Yes Hazel Sams, PA-C  montelukast (SINGULAIR) 10 MG tablet Take 1 tablet (10 mg total) by mouth at bedtime. 11/17/20   Yes Hazel Sams, PA-C  cyclobenzaprine (FLEXERIL) 10 MG tablet Take 10 mg by mouth 3 (three) times daily as needed for muscle spasms.    [provider]  diphenhydramine-acetaminophen (TYLENOL PM) 25-500 MG TABS tablet Take 1 tablet by mouth at bedtime as needed (sleep).     [provider]  doxycycline (VIBRAMYCIN) 100 MG capsule Take 1 capsule (100 mg total) by mouth 2 (two) times daily. 09/22/20   Rodriguez-Southworth, Sunday Spillers, PA-C  lovastatin (MEVACOR) 40 MG tablet Take 40 mg by mouth daily.  06/22/14   [provider]  MELATONIN PO Take 5 mg by mouth at bedtime.    [provider]  Multiple Vitamin (MULTIVITAMIN WITH MINERALS) TABS tablet Take 1 tablet by mouth daily.    [provider]  OVER THE COUNTER MEDICATION Take 20 mg by mouth daily. CBD oil  1 dropper = 20mg     [provider]  pantoprazole (PROTONIX) 40 MG tablet Take 40 mg by mouth daily.     [provider]  valACYclovir (VALTREX) 1000 MG tablet Take 1,000 mg by mouth 2 (two) times daily as needed (cold sores). For 2 days    [provider]    Family History Family History  Problem Relation Age of Onset  . Depression Mother   . Lung cancer Father   . Alcoholism Brother     Social History Social History   Tobacco Use  . Smoking status: Current Some Day  Smoker    Types: Cigars  . Smokeless tobacco: Never Used  . Tobacco comment: once a year  Vaping Use  . Vaping Use: Never used  Substance Use Topics  . Alcohol use: Yes    Alcohol/week: 10.0 standard drinks    Types: 5 Glasses of wine, 5 Cans of beer per week    Comment: 5-6 per week  . Drug use: No    Comment: occ     Allergies   Patient has no known allergies.   Review of Systems Review of Systems  Constitutional: Negative for appetite change, chills and fever.  HENT: Positive for congestion. Negative for ear pain, rhinorrhea, sinus pressure, sinus pain and sore throat.   Eyes:  Negative for redness and visual disturbance.  Respiratory: Negative for cough, chest tightness, shortness of breath and wheezing.   Cardiovascular: Negative for chest pain and palpitations.  Gastrointestinal: Negative for abdominal pain, constipation, diarrhea, nausea and vomiting.  Genitourinary: Negative for dysuria, frequency and urgency.  Musculoskeletal: Negative for myalgias.  Neurological: Negative for dizziness, weakness and headaches.  Psychiatric/Behavioral: Negative for confusion.  All other systems reviewed and are negative.    Physical Exam Triage Vital Signs ED Triage Vitals  Enc Vitals Group     BP 11/17/20 0841 121/87     Pulse Rate 11/17/20 0841 86     Resp 11/17/20 0841 18     Temp 11/17/20 0841 97.8 F (36.6 C)     Temp src --      SpO2 11/17/20 0841 98 %     Weight --      Height --      Head Circumference --      Peak Flow --      Pain Score 11/17/20 0839 4     Pain Loc --      Pain Edu? --      Excl. in [place]? --    No data found.  Updated Vital Signs BP 121/87   Pulse 86   Temp 97.8 F (36.6 C)   Resp 18   SpO2 98%   Visual Acuity Right Eye Distance:   Left Eye Distance:   Bilateral Distance:    Right Eye Near:   Left Eye Near:    Bilateral Near:     Physical Exam Vitals reviewed.  Constitutional:      General: He is not in acute distress.    Appearance: Normal appearance. He is not ill-appearing.  HENT:     Head: Normocephalic and atraumatic.     Right Ear: Hearing, tympanic membrane, ear canal and external ear normal. No swelling or tenderness. There is no impacted cerumen. No mastoid tenderness. Tympanic membrane is not perforated, erythematous, retracted or bulging.     Left Ear: Hearing, tympanic membrane, ear canal and external ear normal. No swelling or tenderness. There is no impacted cerumen. No mastoid tenderness. Tympanic membrane is not perforated, erythematous, retracted or bulging.     Nose: Congestion present.     Right  Sinus: No maxillary sinus tenderness or frontal sinus tenderness.     Left Sinus: No maxillary sinus tenderness or frontal sinus tenderness.     Mouth/Throat:     Mouth: Mucous membranes are moist.     Pharynx: Uvula midline. No oropharyngeal exudate or posterior oropharyngeal erythema.     Tonsils: No tonsillar exudate. 0 on the right. 0 on the left.  Eyes:     Extraocular Movements: Extraocular movements intact.  Pupils: Pupils are equal, round, and reactive to light.  Cardiovascular:     Rate and Rhythm: Normal rate and regular rhythm.     Heart sounds: Normal heart sounds.  Pulmonary:     Breath sounds: Normal breath sounds and air entry. No wheezing, rhonchi or rales.  Chest:     Chest wall: No tenderness.  Abdominal:     General: Abdomen is flat. Bowel sounds are normal.     Tenderness: There is no abdominal tenderness. There is no guarding or rebound.  Lymphadenopathy:     Cervical: No cervical adenopathy.  Neurological:     General: No focal deficit present.     Mental Status: He is alert and oriented to person, place, and time.  Psychiatric:        Attention and Perception: Attention and perception normal.        Mood and Affect: Mood and affect normal.        Behavior: Behavior normal. Behavior is cooperative.        Thought Content: Thought content normal.        Judgment: Judgment normal.      UC Treatments / Results  Labs (all labs ordered are listed, but only abnormal results are displayed) Labs Reviewed - No data to display  EKG   Radiology No results found.  Procedures Procedures (including critical care time)  Medications Ordered in UC Medications - No data to display  Initial Impression / Assessment and Plan / UC Course  I have reviewed the triage vital signs and the nursing notes.  Pertinent labs & imaging results that were available during my care of the patient were reviewed by me and considered in my medical decision making (see chart for  details).     This patient is a 65 year old male presenting with allergic rhinitis. Today this pt is afebrile nontachycardic nontachypneic, oxygenating well on room air, no wheezes rhonchi or rales.   Trial of Zyrtec.  If he fails 1 week trial of this, will escalate to Singulair; prescription sent.  Continue nasal steroid. Follow-up with ear nose and throat provider symptoms persist. Discussed symptoms of sinus infection, follow-up with [place] if symptoms worsen.  ED return precautions discussed.  This chart was dictated using voice recognition software, Dragon. Despite the best efforts of this provider to proofread and correct errors, errors may still occur which can change documentation meaning.  Final Clinical Impressions(s) / UC Diagnoses   Final diagnoses:  Seasonal allergic rhinitis due to pollen     Discharge Instructions     -Try Zyrtec (cetirizine) for 1 week.  Take this daily.  If your symptoms persist, stop Zyrtec and then try Singulair (montelukast) for 1 week. -Also continue the nasal steroid that you were using. -If your symptoms persist, follow-up with ear nose and throat provider -If your symptoms worsen like facial pressure, consistently bloody mucus, fever/chills-come back and see [place].  May be time for antibiotic.    ED Prescriptions    Medication Sig Dispense Auth. Provider   cetirizine (ZYRTEC ALLERGY) 10 MG tablet Take 1 tablet (10 mg total) by mouth daily. 30 tablet Hazel Sams, PA-C   montelukast (SINGULAIR) 10 MG tablet Take 1 tablet (10 mg total) by mouth at bedtime. 30 tablet Hazel Sams, PA-C     PDMP not reviewed this encounter.   Hazel Sams, PA-C 11/17/20 [phone]

## 2020-11-17 NOTE — ED Triage Notes (Signed)
Pt returns with continuing nasal congestion, states that symptoms have persisted for over a month

## 2020-11-24 DIAGNOSIS — D225 Melanocytic nevi of trunk: Secondary | ICD-10-CM | POA: Diagnosis not present

## 2020-11-24 DIAGNOSIS — Z85828 Personal history of other malignant neoplasm of skin: Secondary | ICD-10-CM | POA: Diagnosis not present

## 2020-11-24 DIAGNOSIS — L111 Transient acantholytic dermatosis [Grover]: Secondary | ICD-10-CM | POA: Diagnosis not present

## 2020-11-24 DIAGNOSIS — L821 Other seborrheic keratosis: Secondary | ICD-10-CM | POA: Diagnosis not present

## 2020-11-24 DIAGNOSIS — C44319 Basal cell carcinoma of skin of other parts of face: Secondary | ICD-10-CM | POA: Diagnosis not present

## 2020-12-11 ENCOUNTER — Ambulatory Visit: Payer: Self-pay | Admitting: Podiatry

## 2020-12-14 ENCOUNTER — Encounter: Payer: Self-pay | Admitting: Podiatry

## 2020-12-14 ENCOUNTER — Other Ambulatory Visit: Payer: Self-pay

## 2020-12-14 ENCOUNTER — Ambulatory Visit (INDEPENDENT_AMBULATORY_CARE_PROVIDER_SITE_OTHER): Payer: BC Managed Care – PPO | Admitting: Podiatry

## 2020-12-14 DIAGNOSIS — L600 Ingrowing nail: Secondary | ICD-10-CM

## 2020-12-14 MED ORDER — NEOMYCIN-POLYMYXIN-HC 3.5-10000-1 OT SOLN: OTIC | 1 refills | Status: DC

## 2020-12-14 NOTE — Patient Instructions (Signed)

## 2020-12-15 NOTE — Progress Notes (Signed)
Subjective:   Patient ID: Manuel Harper, male   DOB: 65 y.o.   MRN: [id number]   HPI Patient states he has developed chronic ingrown toenail of his big toes both feet that is been going on for a number of months and is painful.  States it becomes sore and makes it hard for him to wear shoe gear and states he is tried to trim them and soak them without relief and does not smoke likes to be active   Review of Systems  All other systems reviewed and are negative.       Objective:  Physical Exam Vitals and nursing note reviewed.  Constitutional:      Appearance: He is well-developed.  Pulmonary:     Effort: Pulmonary effort is normal.  Musculoskeletal:        General: Normal range of motion.  Skin:    General: Skin is warm.  Neurological:     Mental Status: He is alert.     Neurovascular status intact muscle strength found to be adequate range of motion adequate.  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  I recommended correction explained procedure risk and patient wants surgical intervention in this case.  I allowed him to read consent form understanding risk and today I infiltrated each hallux 60 mg like Marcaine mixture sterile prep done and using sterile instrumentation remove the medial border bilateral exposed matrix applied phenol 3 applications 30 seconds followed by alcohol lavage sterile dressing gave instructions for soaks.  Patient is encouraged to call questions concerns and leave dressings on 24 hours but take them off earlier if any throbbing were to occur

## 2020-12-17 ENCOUNTER — Ambulatory Visit
Admission: RE | Admit: 2020-12-17 | Discharge: 2020-12-17 | Disposition: A | Discharge status: Home / Self Care | Payer: BC Managed Care – PPO | Source: Ambulatory Visit | Attending: Pain Medicine | Admitting: Pain Medicine

## 2020-12-17 ENCOUNTER — Other Ambulatory Visit: Payer: Self-pay

## 2020-12-17 DIAGNOSIS — M5416 Radiculopathy, lumbar region: Secondary | ICD-10-CM

## 2020-12-17 DIAGNOSIS — M4326 Fusion of spine, lumbar region: Secondary | ICD-10-CM | POA: Diagnosis not present

## 2020-12-17 MED ORDER — GADOBENATE DIMEGLUMINE 529 MG/ML IV SOLN
17.0000 mL | Freq: Once | INTRAVENOUS | Status: AC | PRN
  Administered 2020-12-17: 17 mL via INTRAVENOUS

## 2020-12-21 DIAGNOSIS — C44319 Basal cell carcinoma of skin of other parts of face: Secondary | ICD-10-CM | POA: Diagnosis not present

## 2020-12-22 DIAGNOSIS — M5416 Radiculopathy, lumbar region: Secondary | ICD-10-CM | POA: Diagnosis not present

## 2020-12-22 DIAGNOSIS — R030 Elevated blood-pressure reading, without diagnosis of hypertension: Secondary | ICD-10-CM | POA: Diagnosis not present

## 2020-12-23 DIAGNOSIS — R6889 Other general symptoms and signs: Secondary | ICD-10-CM | POA: Diagnosis not present

## 2021-01-12 DIAGNOSIS — J322 Chronic ethmoidal sinusitis: Secondary | ICD-10-CM | POA: Diagnosis not present

## 2021-01-12 DIAGNOSIS — R0982 Postnasal drip: Secondary | ICD-10-CM | POA: Diagnosis not present

## 2021-01-12 DIAGNOSIS — R490 Dysphonia: Secondary | ICD-10-CM | POA: Diagnosis not present

## 2021-01-12 DIAGNOSIS — J320 Chronic maxillary sinusitis: Secondary | ICD-10-CM | POA: Diagnosis not present

## 2021-01-12 DIAGNOSIS — J3489 Other specified disorders of nose and nasal sinuses: Secondary | ICD-10-CM | POA: Diagnosis not present

## 2021-01-15 DIAGNOSIS — M1711 Unilateral primary osteoarthritis, right knee: Secondary | ICD-10-CM | POA: Diagnosis not present

## 2021-01-15 DIAGNOSIS — Z96652 Presence of left artificial knee joint: Secondary | ICD-10-CM | POA: Diagnosis not present

## 2021-01-25 DIAGNOSIS — M5416 Radiculopathy, lumbar region: Secondary | ICD-10-CM | POA: Diagnosis not present

## 2021-02-07 IMAGING — DX DG CHEST 2V
2 series · 2 of 2 positions shown · non-contrast
Comparison: May 30, 2018

CLINICAL DATA: Cough

EXAM:
CHEST - 2 VIEW

[dg chest 2 view (1 of 2)]
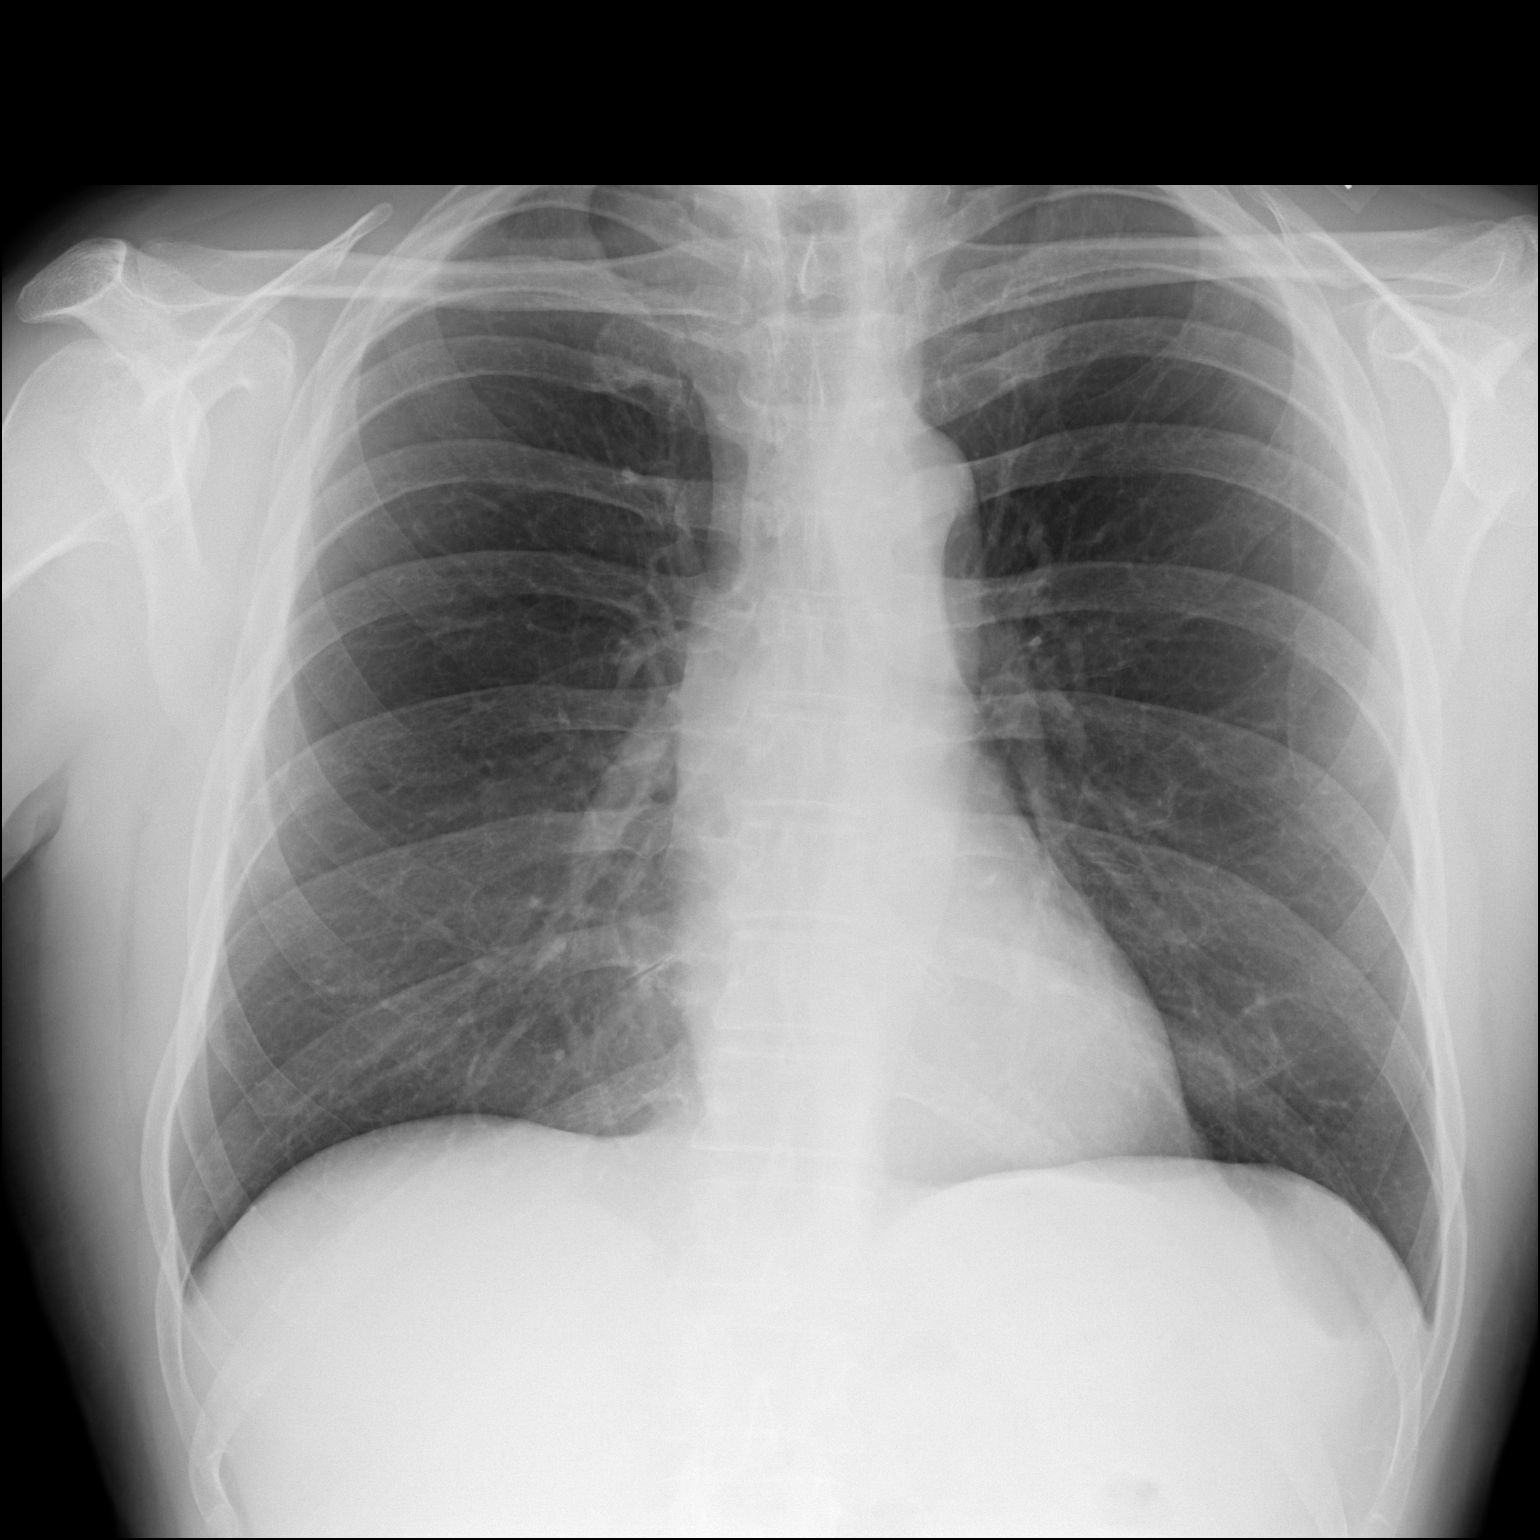

[dg chest 2 view (2 of 2)]
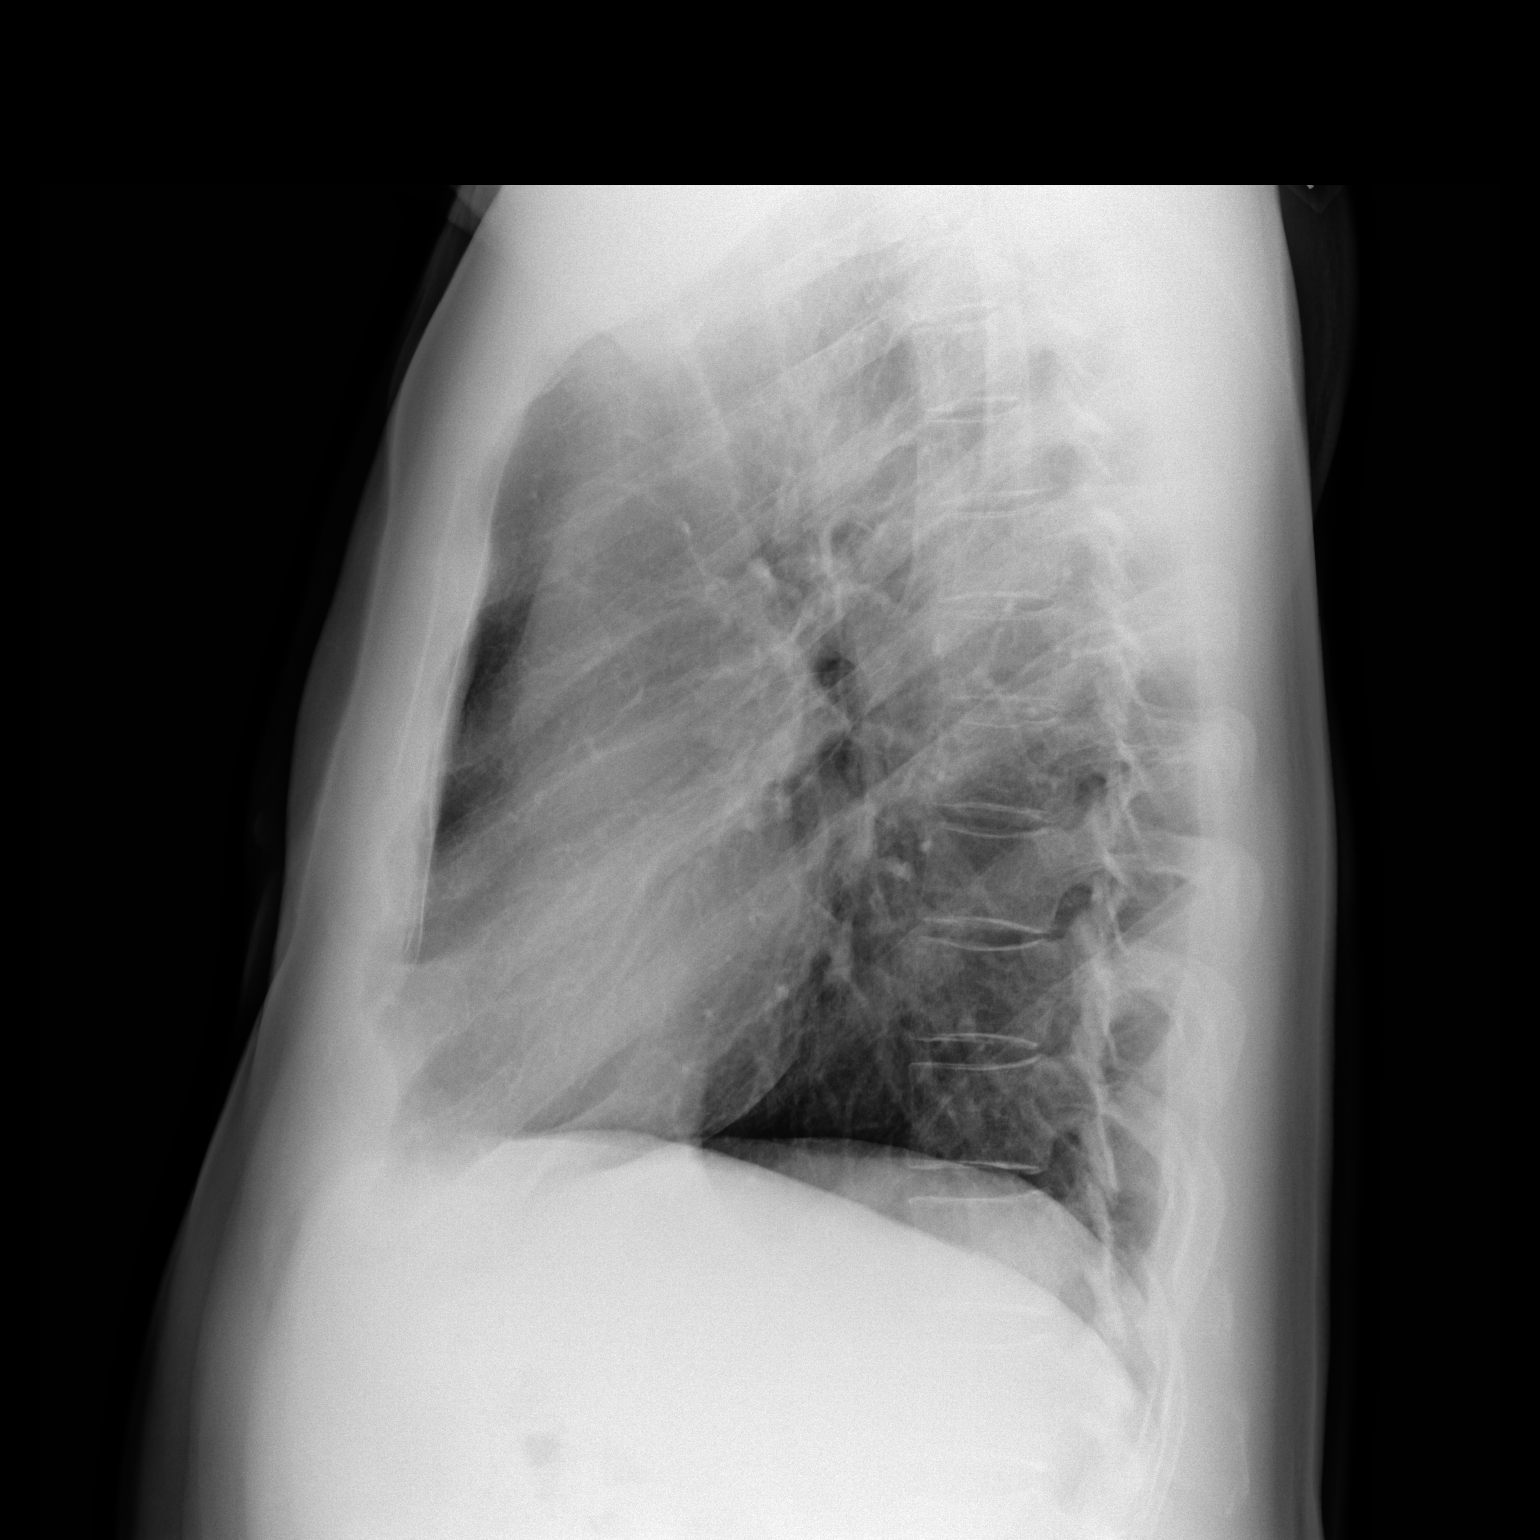

[2 of 2 positions shown; findings below may reference images not displayed]

FINDINGS: The lungs are clear. Heart size and pulmonary vascularity are
normal. No adenopathy. No bone lesions.
IMPRESSION: No abnormality noted.

## 2021-02-12 DIAGNOSIS — M25561 Pain in right knee: Secondary | ICD-10-CM | POA: Diagnosis not present

## 2021-02-12 DIAGNOSIS — Z01810 Encounter for preprocedural cardiovascular examination: Secondary | ICD-10-CM | POA: Diagnosis not present

## 2021-03-12 DIAGNOSIS — M19011 Primary osteoarthritis, right shoulder: Secondary | ICD-10-CM | POA: Diagnosis not present

## 2021-03-12 DIAGNOSIS — M25511 Pain in right shoulder: Secondary | ICD-10-CM | POA: Diagnosis not present

## 2021-03-12 DIAGNOSIS — S46001A Unspecified injury of muscle(s) and tendon(s) of the rotator cuff of right shoulder, initial encounter: Secondary | ICD-10-CM | POA: Diagnosis not present

## 2021-03-16 DIAGNOSIS — R0982 Postnasal drip: Secondary | ICD-10-CM | POA: Diagnosis not present

## 2021-03-16 DIAGNOSIS — J322 Chronic ethmoidal sinusitis: Secondary | ICD-10-CM | POA: Diagnosis not present

## 2021-03-16 DIAGNOSIS — J320 Chronic maxillary sinusitis: Secondary | ICD-10-CM | POA: Diagnosis not present

## 2021-03-19 DIAGNOSIS — G5602 Carpal tunnel syndrome, left upper limb: Secondary | ICD-10-CM | POA: Diagnosis not present

## 2021-03-19 DIAGNOSIS — M19032 Primary osteoarthritis, left wrist: Secondary | ICD-10-CM | POA: Diagnosis not present

## 2021-03-19 DIAGNOSIS — G8918 Other acute postprocedural pain: Secondary | ICD-10-CM | POA: Diagnosis not present

## 2021-03-19 DIAGNOSIS — M24132 Other articular cartilage disorders, left wrist: Secondary | ICD-10-CM | POA: Diagnosis not present

## 2021-03-22 DIAGNOSIS — G5602 Carpal tunnel syndrome, left upper limb: Secondary | ICD-10-CM | POA: Diagnosis not present

## 2021-04-01 DIAGNOSIS — Z4789 Encounter for other orthopedic aftercare: Secondary | ICD-10-CM | POA: Diagnosis not present

## 2021-04-15 DIAGNOSIS — M25632 Stiffness of left wrist, not elsewhere classified: Secondary | ICD-10-CM | POA: Diagnosis not present

## 2021-04-15 DIAGNOSIS — M19032 Primary osteoarthritis, left wrist: Secondary | ICD-10-CM | POA: Diagnosis not present

## 2021-04-15 DIAGNOSIS — G5602 Carpal tunnel syndrome, left upper limb: Secondary | ICD-10-CM | POA: Diagnosis not present

## 2021-04-15 DIAGNOSIS — Z4789 Encounter for other orthopedic aftercare: Secondary | ICD-10-CM | POA: Diagnosis not present

## 2021-04-21 DIAGNOSIS — M25632 Stiffness of left wrist, not elsewhere classified: Secondary | ICD-10-CM | POA: Diagnosis not present

## 2021-04-27 DIAGNOSIS — M25632 Stiffness of left wrist, not elsewhere classified: Secondary | ICD-10-CM | POA: Diagnosis not present

## 2021-05-12 DIAGNOSIS — M25632 Stiffness of left wrist, not elsewhere classified: Secondary | ICD-10-CM | POA: Diagnosis not present

## 2021-05-19 DIAGNOSIS — G5602 Carpal tunnel syndrome, left upper limb: Secondary | ICD-10-CM | POA: Diagnosis not present

## 2021-05-19 DIAGNOSIS — M25632 Stiffness of left wrist, not elsewhere classified: Secondary | ICD-10-CM | POA: Diagnosis not present

## 2021-05-19 DIAGNOSIS — E7800 Pure hypercholesterolemia, unspecified: Secondary | ICD-10-CM | POA: Diagnosis not present

## 2021-05-19 DIAGNOSIS — Z4789 Encounter for other orthopedic aftercare: Secondary | ICD-10-CM | POA: Diagnosis not present

## 2021-05-19 DIAGNOSIS — Z125 Encounter for screening for malignant neoplasm of prostate: Secondary | ICD-10-CM | POA: Diagnosis not present

## 2021-05-19 DIAGNOSIS — D649 Anemia, unspecified: Secondary | ICD-10-CM | POA: Diagnosis not present

## 2021-05-19 DIAGNOSIS — M519 Unspecified thoracic, thoracolumbar and lumbosacral intervertebral disc disorder: Secondary | ICD-10-CM | POA: Diagnosis not present

## 2021-05-19 DIAGNOSIS — Z79899 Other long term (current) drug therapy: Secondary | ICD-10-CM | POA: Diagnosis not present

## 2021-05-19 DIAGNOSIS — Z0000 Encounter for general adult medical examination without abnormal findings: Secondary | ICD-10-CM | POA: Diagnosis not present

## 2021-05-19 DIAGNOSIS — M19032 Primary osteoarthritis, left wrist: Secondary | ICD-10-CM | POA: Diagnosis not present

## 2021-05-25 DIAGNOSIS — M25632 Stiffness of left wrist, not elsewhere classified: Secondary | ICD-10-CM | POA: Diagnosis not present

## 2021-06-01 DIAGNOSIS — M25632 Stiffness of left wrist, not elsewhere classified: Secondary | ICD-10-CM | POA: Diagnosis not present

## 2021-06-07 DIAGNOSIS — M25632 Stiffness of left wrist, not elsewhere classified: Secondary | ICD-10-CM | POA: Diagnosis not present

## 2021-06-07 DIAGNOSIS — D649 Anemia, unspecified: Secondary | ICD-10-CM | POA: Diagnosis not present

## 2021-06-16 DIAGNOSIS — B370 Candidal stomatitis: Secondary | ICD-10-CM | POA: Diagnosis not present

## 2021-06-21 DIAGNOSIS — M25632 Stiffness of left wrist, not elsewhere classified: Secondary | ICD-10-CM | POA: Diagnosis not present

## 2021-06-21 DIAGNOSIS — Z4789 Encounter for other orthopedic aftercare: Secondary | ICD-10-CM | POA: Diagnosis not present

## 2021-06-21 DIAGNOSIS — M25532 Pain in left wrist: Secondary | ICD-10-CM | POA: Diagnosis not present

## 2021-06-21 DIAGNOSIS — M19032 Primary osteoarthritis, left wrist: Secondary | ICD-10-CM | POA: Diagnosis not present

## 2021-06-21 DIAGNOSIS — G5602 Carpal tunnel syndrome, left upper limb: Secondary | ICD-10-CM | POA: Diagnosis not present

## 2021-06-22 ENCOUNTER — Ambulatory Visit
Admission: EM | Admit: 2021-06-22 | Discharge: 2021-06-22 | Disposition: A | Discharge status: Home / Self Care | Payer: Medicare Other | Attending: Physician Assistant | Admitting: Physician Assistant

## 2021-06-22 ENCOUNTER — Other Ambulatory Visit: Payer: Self-pay

## 2021-06-22 DIAGNOSIS — J019 Acute sinusitis, unspecified: Secondary | ICD-10-CM | POA: Diagnosis not present

## 2021-06-22 MED ORDER — AMOXICILLIN-POT CLAVULANATE 875-125 MG PO TABS: 1.0000 | ORAL_TABLET | Freq: Two times a day (BID) | ORAL | 0 refills | Status: DC

## 2021-06-22 NOTE — ED Provider Notes (Signed)
[facility]    CSN: [id number] Arrival date & time: 06/22/21  7416      History   Chief Complaint Chief Complaint  Patient presents with   nasal drainage    HPI Manuel Harper is a 65 y.o. male.   Patient here today for evaluation of nasal congestion and drainage that seems to be progressing with time.  He reports he has had worsening sinus pressure as well.  He has not had significant cough but has had some sore throat at times due to postnasal drip.  Has tried taking Mucinex without significant relief.  The history is provided by the patient.   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    Past Surgical History:  Procedure Laterality Date   ANTERIOR CRUCIATE LIGAMENT REPAIR Bilateral    KNEE ARTHROSCOPY Bilateral    over yrs   LUMBAR LAMINECTOMY/DECOMPRESSION MICRODISCECTOMY Bilateral 09/08/2014   Procedure: LUMBAR LAMINECTOMY/DECOMPRESSION MICRODISCECTOMY 1 LEVEL;  Surgeon: Hosie Spangle, MD;  Location: [facility];  Service: Neurosurgery;  Laterality: Bilateral;  bilateral L45 lumbar laminotomy and foraminotomy   SPINAL FUSION     TOTAL KNEE ARTHROPLASTY Left 09/14/2018   Procedure: TOTAL KNEE ARTHROPLASTY;  Surgeon: Sydnee Cabal, MD;  Location: [facility];  Service: Orthopedics;  Laterality: Left;       Home Medications    Prior to Admission medications   Medication Sig Start Date End Date Taking? Authorizing Provider  amoxicillin-clavulanate (AUGMENTIN) 875-125 MG tablet Take 1 tablet by mouth every 12 (twelve) hours. 06/22/21  Yes Francene Finders, PA-C  azelastine (ASTELIN) 0.1 % nasal spray 1-2 SPRAYS IN EACH NOSTRIL NASALLY TWICE A DAY     [provider]  bifidobacterium infantis (ALIGN) capsule See admin instructions.    [provider]  diazepam (VALIUM) 5 MG tablet diazepam 5 mg tablet  TAKE 1 TABLET BY ORAL ROUTE 45 MINUTES PRIOR TO A MAY REPEAT AFTER 20 MINUTES, AS NEEDED 10/20/20   [provider]  diphenhydramine-acetaminophen (TYLENOL PM) 25-500 MG TABS tablet Take 1 tablet by mouth at bedtime as needed (sleep).     [provider]  docusate sodium (COLACE) 100 MG capsule 1 capsule as needed    [provider]  gabapentin (NEURONTIN) 300 MG capsule Take 1 capsule by mouth daily. 11/16/20   [provider]  lovastatin (MEVACOR) 40 MG tablet Take 40 mg by mouth daily.  06/22/14   [provider]  Melatonin 5 MG SUBL melatonin 5 mg sublingual tablet   5 mg every day by sublingual route.    [provider]  meloxicam (MOBIC) 15 MG tablet TAKE 1 TABLET ONCE A DAY AS NEEDED WITH FOOD FOR PAIN    [provider]  montelukast (SINGULAIR) 10 MG tablet Take 1 tablet (10 mg total) by mouth at bedtime. 11/17/20   Hazel Sams, PA-C  neomycin-polymyxin-hydrocortisone (CORTISPORIN) OTIC solution Apply 1-2 drops to toe after soaking BID 12/14/20   Regal, Tamala Fothergill, DPM  OVER THE COUNTER MEDICATION Take 20 mg by mouth daily. CBD oil  1 dropper = 20mg   [provider]  valACYclovir (VALTREX) 1000 MG tablet Take 1,000 mg by mouth 2 (two) times daily as needed (cold sores). For 2 days    [provider]    Family History Family History  Problem Relation Age of Onset   Depression Mother    Lung cancer Father    Alcoholism Brother     Social History Social History   Tobacco Use   Smoking status: Some Days    Types: Cigars   Smokeless tobacco: Never   Tobacco comments:    once a year  Vaping Use   Vaping Use: Never used  Substance Use Topics   Alcohol use: Yes    Alcohol/week: 10.0 standard drinks    Types: 5 Glasses of wine, 5  Cans of beer per week    Comment: 5-6 per week   Drug use: No    Comment: occ     Allergies   Patient has no known allergies.   Review of Systems Review of Systems  Constitutional:  Negative for chills and fever.  HENT:  Positive for congestion, sinus pressure and sore throat. Negative for ear pain.   Eyes:  Negative for discharge and redness.  Respiratory:  Negative for cough and shortness of breath.   Gastrointestinal:  Negative for abdominal pain, nausea and vomiting.    Physical Exam Triage Vital Signs ED Triage Vitals  Enc Vitals Group     BP 06/22/21 1036 (!) 151/89     Pulse Rate 06/22/21 1036 78     Resp 06/22/21 1036 18     Temp 06/22/21 1036 97.8 F (36.6 C)     Temp Source 06/22/21 1036 Oral     SpO2 06/22/21 1036 98 %     Weight --      Height --      Head Circumference --      Peak Flow --      Pain Score 06/22/21 1039 0     Pain Loc --      Pain Edu? --      Excl. in [place]? --    No data found.  Updated Vital Signs BP (!) 151/89 (BP Location: Left Arm)   Pulse 78   Temp 97.8 F (36.6 C) (Oral)   Resp 18   SpO2 98%      Physical Exam Vitals and nursing note reviewed.  Constitutional:      General: He is not in acute distress.    Appearance: Normal appearance. He is not ill-appearing.  HENT:     Head: Normocephalic and atraumatic.     Nose: Congestion present.     Mouth/Throat:     Mouth: Mucous membranes are moist.     Pharynx: Oropharynx is clear. No oropharyngeal exudate or posterior oropharyngeal erythema.  Eyes:     Conjunctiva/sclera: Conjunctivae normal.  Cardiovascular:     Rate and Rhythm: Normal rate and regular rhythm.     Heart sounds: Normal heart sounds. No murmur heard. Pulmonary:     Effort: Pulmonary effort is normal. No respiratory distress.     Breath sounds: Normal breath sounds. No wheezing, rhonchi or rales.  Skin:    General: Skin is warm and dry.  Neurological:     Mental Status: He is alert.  Psychiatric:         Mood and Affect: Mood normal.        Thought Content: Thought content normal.     UC Treatments / Results  Labs (  all labs ordered are listed, but only abnormal results are displayed) Labs Reviewed - No data to display  EKG   Radiology No results found.  Procedures Procedures (including critical care time)  Medications Ordered in UC Medications - No data to display  Initial Impression / Assessment and Plan / UC Course  I have reviewed the triage vital signs and the nursing notes.  Pertinent labs & imaging results that were available during my care of the patient were reviewed by me and considered in my medical decision making (see chart for details).  Augmentin prescribed to cover sinusitis.  Recommended follow-up if symptoms fail to improve or worsen anyway.  Final Clinical Impressions(s) / UC Diagnoses   Final diagnoses:  Acute sinusitis, recurrence not specified, unspecified location   Discharge Instructions   None    ED Prescriptions     Medication Sig Dispense Auth. Provider   amoxicillin-clavulanate (AUGMENTIN) 875-125 MG tablet Take 1 tablet by mouth every 12 (twelve) hours. 14 tablet Francene Finders, PA-C      PDMP not reviewed this encounter.   Francene Finders, PA-C 06/22/21 1342

## 2021-06-22 NOTE — ED Triage Notes (Signed)
One week h/o post nasal drip. Pt was worked up by an UC dx with thrush tx nystatin. 5 day h/o nasal drainage with intermittent sinus pressure. Has been taking mucinex with some relief. Pt suspicious for a sinus infection.

## 2021-07-02 DIAGNOSIS — J322 Chronic ethmoidal sinusitis: Secondary | ICD-10-CM | POA: Diagnosis not present

## 2021-07-02 DIAGNOSIS — J320 Chronic maxillary sinusitis: Secondary | ICD-10-CM | POA: Diagnosis not present

## 2021-07-02 DIAGNOSIS — R0982 Postnasal drip: Secondary | ICD-10-CM | POA: Diagnosis not present

## 2021-08-20 DIAGNOSIS — J320 Chronic maxillary sinusitis: Secondary | ICD-10-CM | POA: Diagnosis not present

## 2021-08-20 DIAGNOSIS — J329 Chronic sinusitis, unspecified: Secondary | ICD-10-CM | POA: Diagnosis not present

## 2021-08-20 DIAGNOSIS — J322 Chronic ethmoidal sinusitis: Secondary | ICD-10-CM | POA: Diagnosis not present

## 2021-08-20 DIAGNOSIS — J310 Chronic rhinitis: Secondary | ICD-10-CM | POA: Diagnosis not present

## 2021-08-20 DIAGNOSIS — J321 Chronic frontal sinusitis: Secondary | ICD-10-CM | POA: Diagnosis not present

## 2021-09-03 DIAGNOSIS — D649 Anemia, unspecified: Secondary | ICD-10-CM | POA: Diagnosis not present

## 2021-09-03 DIAGNOSIS — Z23 Encounter for immunization: Secondary | ICD-10-CM | POA: Diagnosis not present

## 2021-09-06 DIAGNOSIS — Z9889 Other specified postprocedural states: Secondary | ICD-10-CM | POA: Diagnosis not present

## 2021-09-06 DIAGNOSIS — Z48813 Encounter for surgical aftercare following surgery on the respiratory system: Secondary | ICD-10-CM | POA: Diagnosis not present

## 2021-09-06 DIAGNOSIS — J3489 Other specified disorders of nose and nasal sinuses: Secondary | ICD-10-CM | POA: Diagnosis not present

## 2021-09-10 DIAGNOSIS — M25512 Pain in left shoulder: Secondary | ICD-10-CM | POA: Diagnosis not present

## 2021-09-10 DIAGNOSIS — M25511 Pain in right shoulder: Secondary | ICD-10-CM | POA: Diagnosis not present

## 2021-09-20 DIAGNOSIS — J322 Chronic ethmoidal sinusitis: Secondary | ICD-10-CM | POA: Diagnosis not present

## 2021-09-20 DIAGNOSIS — J320 Chronic maxillary sinusitis: Secondary | ICD-10-CM | POA: Diagnosis not present

## 2021-09-20 DIAGNOSIS — J343 Hypertrophy of nasal turbinates: Secondary | ICD-10-CM | POA: Diagnosis not present

## 2021-10-06 DIAGNOSIS — H5213 Myopia, bilateral: Secondary | ICD-10-CM | POA: Diagnosis not present

## 2021-10-19 DIAGNOSIS — J029 Acute pharyngitis, unspecified: Secondary | ICD-10-CM | POA: Diagnosis not present

## 2021-10-28 DIAGNOSIS — R0982 Postnasal drip: Secondary | ICD-10-CM | POA: Diagnosis not present

## 2021-10-28 DIAGNOSIS — Z9889 Other specified postprocedural states: Secondary | ICD-10-CM | POA: Diagnosis not present

## 2021-10-28 DIAGNOSIS — J329 Chronic sinusitis, unspecified: Secondary | ICD-10-CM | POA: Diagnosis not present

## 2021-12-01 DIAGNOSIS — G5602 Carpal tunnel syndrome, left upper limb: Secondary | ICD-10-CM | POA: Diagnosis not present

## 2021-12-01 DIAGNOSIS — Z85828 Personal history of other malignant neoplasm of skin: Secondary | ICD-10-CM | POA: Diagnosis not present

## 2021-12-01 DIAGNOSIS — L570 Actinic keratosis: Secondary | ICD-10-CM | POA: Diagnosis not present

## 2021-12-01 DIAGNOSIS — L821 Other seborrheic keratosis: Secondary | ICD-10-CM | POA: Diagnosis not present

## 2021-12-01 DIAGNOSIS — Z4789 Encounter for other orthopedic aftercare: Secondary | ICD-10-CM | POA: Diagnosis not present

## 2021-12-01 DIAGNOSIS — M19032 Primary osteoarthritis, left wrist: Secondary | ICD-10-CM | POA: Diagnosis not present

## 2021-12-01 DIAGNOSIS — M180 Bilateral primary osteoarthritis of first carpometacarpal joints: Secondary | ICD-10-CM | POA: Diagnosis not present

## 2021-12-01 DIAGNOSIS — D1801 Hemangioma of skin and subcutaneous tissue: Secondary | ICD-10-CM | POA: Diagnosis not present

## 2021-12-02 DIAGNOSIS — M25511 Pain in right shoulder: Secondary | ICD-10-CM | POA: Diagnosis not present

## 2021-12-02 DIAGNOSIS — M25512 Pain in left shoulder: Secondary | ICD-10-CM | POA: Diagnosis not present

## 2022-02-17 DIAGNOSIS — J322 Chronic ethmoidal sinusitis: Secondary | ICD-10-CM | POA: Diagnosis not present

## 2022-02-17 DIAGNOSIS — J320 Chronic maxillary sinusitis: Secondary | ICD-10-CM | POA: Diagnosis not present

## 2022-02-17 DIAGNOSIS — R0982 Postnasal drip: Secondary | ICD-10-CM | POA: Diagnosis not present

## 2022-03-07 DIAGNOSIS — K5289 Other specified noninfective gastroenteritis and colitis: Secondary | ICD-10-CM | POA: Diagnosis not present

## 2022-03-07 DIAGNOSIS — K648 Other hemorrhoids: Secondary | ICD-10-CM | POA: Diagnosis not present

## 2022-03-07 DIAGNOSIS — K5730 Diverticulosis of large intestine without perforation or abscess without bleeding: Secondary | ICD-10-CM | POA: Diagnosis not present

## 2022-03-07 DIAGNOSIS — K633 Ulcer of intestine: Secondary | ICD-10-CM | POA: Diagnosis not present

## 2022-03-07 DIAGNOSIS — Z09 Encounter for follow-up examination after completed treatment for conditions other than malignant neoplasm: Secondary | ICD-10-CM | POA: Diagnosis not present

## 2022-03-07 DIAGNOSIS — Z800 Family history of malignant neoplasm of digestive organs: Secondary | ICD-10-CM | POA: Diagnosis not present

## 2022-03-07 DIAGNOSIS — Z86010 Personal history of colonic polyps: Secondary | ICD-10-CM | POA: Diagnosis not present

## 2022-03-07 DIAGNOSIS — K6289 Other specified diseases of anus and rectum: Secondary | ICD-10-CM | POA: Diagnosis not present

## 2022-03-09 DIAGNOSIS — K5289 Other specified noninfective gastroenteritis and colitis: Secondary | ICD-10-CM | POA: Diagnosis not present

## 2022-03-17 DIAGNOSIS — M25512 Pain in left shoulder: Secondary | ICD-10-CM | POA: Diagnosis not present

## 2022-03-17 DIAGNOSIS — M25511 Pain in right shoulder: Secondary | ICD-10-CM | POA: Diagnosis not present

## 2022-03-30 DIAGNOSIS — M546 Pain in thoracic spine: Secondary | ICD-10-CM | POA: Diagnosis not present

## 2022-04-20 DIAGNOSIS — M25511 Pain in right shoulder: Secondary | ICD-10-CM | POA: Diagnosis not present

## 2022-04-20 DIAGNOSIS — M25512 Pain in left shoulder: Secondary | ICD-10-CM | POA: Diagnosis not present

## 2022-05-24 DIAGNOSIS — Z0000 Encounter for general adult medical examination without abnormal findings: Secondary | ICD-10-CM | POA: Diagnosis not present

## 2022-05-24 DIAGNOSIS — E7800 Pure hypercholesterolemia, unspecified: Secondary | ICD-10-CM | POA: Diagnosis not present

## 2022-05-24 DIAGNOSIS — D649 Anemia, unspecified: Secondary | ICD-10-CM | POA: Diagnosis not present

## 2022-05-24 DIAGNOSIS — Z79899 Other long term (current) drug therapy: Secondary | ICD-10-CM | POA: Diagnosis not present

## 2022-05-24 DIAGNOSIS — Z125 Encounter for screening for malignant neoplasm of prostate: Secondary | ICD-10-CM | POA: Diagnosis not present

## 2022-05-24 DIAGNOSIS — M519 Unspecified thoracic, thoracolumbar and lumbosacral intervertebral disc disorder: Secondary | ICD-10-CM | POA: Diagnosis not present

## 2022-06-15 ENCOUNTER — Other Ambulatory Visit: Payer: Self-pay | Admitting: Family Medicine

## 2022-06-15 ENCOUNTER — Ambulatory Visit
Admission: RE | Admit: 2022-06-15 | Discharge: 2022-06-15 | Disposition: A | Discharge status: Home / Self Care | Payer: Medicare Other | Source: Ambulatory Visit | Attending: Family Medicine | Admitting: Family Medicine

## 2022-06-15 DIAGNOSIS — K219 Gastro-esophageal reflux disease without esophagitis: Secondary | ICD-10-CM | POA: Diagnosis not present

## 2022-06-15 DIAGNOSIS — R059 Cough, unspecified: Secondary | ICD-10-CM | POA: Diagnosis not present

## 2022-06-15 DIAGNOSIS — L989 Disorder of the skin and subcutaneous tissue, unspecified: Secondary | ICD-10-CM | POA: Diagnosis not present

## 2022-07-15 DIAGNOSIS — J019 Acute sinusitis, unspecified: Secondary | ICD-10-CM | POA: Diagnosis not present

## 2022-07-19 DIAGNOSIS — R0982 Postnasal drip: Secondary | ICD-10-CM | POA: Diagnosis not present

## 2022-08-26 DIAGNOSIS — Z01818 Encounter for other preprocedural examination: Secondary | ICD-10-CM | POA: Diagnosis not present

## 2022-09-13 DIAGNOSIS — G8918 Other acute postprocedural pain: Secondary | ICD-10-CM | POA: Diagnosis not present

## 2022-09-13 DIAGNOSIS — M1711 Unilateral primary osteoarthritis, right knee: Secondary | ICD-10-CM | POA: Diagnosis not present

## 2022-09-13 DIAGNOSIS — Z96651 Presence of right artificial knee joint: Secondary | ICD-10-CM | POA: Diagnosis not present

## 2022-09-15 DIAGNOSIS — M25561 Pain in right knee: Secondary | ICD-10-CM | POA: Diagnosis not present

## 2022-09-15 DIAGNOSIS — M25661 Stiffness of right knee, not elsewhere classified: Secondary | ICD-10-CM | POA: Diagnosis not present

## 2022-09-19 DIAGNOSIS — M25561 Pain in right knee: Secondary | ICD-10-CM | POA: Diagnosis not present

## 2022-09-19 DIAGNOSIS — M25661 Stiffness of right knee, not elsewhere classified: Secondary | ICD-10-CM | POA: Diagnosis not present

## 2022-09-21 DIAGNOSIS — M25561 Pain in right knee: Secondary | ICD-10-CM | POA: Diagnosis not present

## 2022-09-21 DIAGNOSIS — M25661 Stiffness of right knee, not elsewhere classified: Secondary | ICD-10-CM | POA: Diagnosis not present

## 2022-09-23 DIAGNOSIS — M25561 Pain in right knee: Secondary | ICD-10-CM | POA: Diagnosis not present

## 2022-09-23 DIAGNOSIS — M25661 Stiffness of right knee, not elsewhere classified: Secondary | ICD-10-CM | POA: Diagnosis not present

## 2022-09-26 DIAGNOSIS — M25661 Stiffness of right knee, not elsewhere classified: Secondary | ICD-10-CM | POA: Diagnosis not present

## 2022-09-28 DIAGNOSIS — M25561 Pain in right knee: Secondary | ICD-10-CM | POA: Diagnosis not present

## 2022-09-28 DIAGNOSIS — M25661 Stiffness of right knee, not elsewhere classified: Secondary | ICD-10-CM | POA: Diagnosis not present

## 2022-09-28 DIAGNOSIS — Z4789 Encounter for other orthopedic aftercare: Secondary | ICD-10-CM | POA: Diagnosis not present

## 2022-09-30 DIAGNOSIS — M25661 Stiffness of right knee, not elsewhere classified: Secondary | ICD-10-CM | POA: Diagnosis not present

## 2022-09-30 DIAGNOSIS — M1712 Unilateral primary osteoarthritis, left knee: Secondary | ICD-10-CM | POA: Diagnosis not present

## 2022-10-03 DIAGNOSIS — M25561 Pain in right knee: Secondary | ICD-10-CM | POA: Diagnosis not present

## 2022-10-03 DIAGNOSIS — M25661 Stiffness of right knee, not elsewhere classified: Secondary | ICD-10-CM | POA: Diagnosis not present

## 2022-10-05 DIAGNOSIS — M25661 Stiffness of right knee, not elsewhere classified: Secondary | ICD-10-CM | POA: Diagnosis not present

## 2022-10-07 DIAGNOSIS — M25661 Stiffness of right knee, not elsewhere classified: Secondary | ICD-10-CM | POA: Diagnosis not present

## 2022-10-10 DIAGNOSIS — M25661 Stiffness of right knee, not elsewhere classified: Secondary | ICD-10-CM | POA: Diagnosis not present

## 2022-10-10 DIAGNOSIS — M25561 Pain in right knee: Secondary | ICD-10-CM | POA: Diagnosis not present

## 2022-10-12 DIAGNOSIS — M25661 Stiffness of right knee, not elsewhere classified: Secondary | ICD-10-CM | POA: Diagnosis not present

## 2022-10-12 DIAGNOSIS — M25561 Pain in right knee: Secondary | ICD-10-CM | POA: Diagnosis not present

## 2022-10-18 DIAGNOSIS — M25661 Stiffness of right knee, not elsewhere classified: Secondary | ICD-10-CM | POA: Diagnosis not present

## 2022-10-18 DIAGNOSIS — M25561 Pain in right knee: Secondary | ICD-10-CM | POA: Diagnosis not present

## 2022-10-20 DIAGNOSIS — M25661 Stiffness of right knee, not elsewhere classified: Secondary | ICD-10-CM | POA: Diagnosis not present

## 2022-10-20 DIAGNOSIS — H5213 Myopia, bilateral: Secondary | ICD-10-CM | POA: Diagnosis not present

## 2022-10-25 DIAGNOSIS — M25561 Pain in right knee: Secondary | ICD-10-CM | POA: Diagnosis not present

## 2022-10-25 DIAGNOSIS — M25661 Stiffness of right knee, not elsewhere classified: Secondary | ICD-10-CM | POA: Diagnosis not present

## 2022-11-07 DIAGNOSIS — H5213 Myopia, bilateral: Secondary | ICD-10-CM | POA: Diagnosis not present

## 2022-11-08 DIAGNOSIS — M25661 Stiffness of right knee, not elsewhere classified: Secondary | ICD-10-CM | POA: Diagnosis not present

## 2022-11-08 DIAGNOSIS — M25561 Pain in right knee: Secondary | ICD-10-CM | POA: Diagnosis not present

## 2022-11-10 DIAGNOSIS — M25661 Stiffness of right knee, not elsewhere classified: Secondary | ICD-10-CM | POA: Diagnosis not present

## 2022-11-14 DIAGNOSIS — B349 Viral infection, unspecified: Secondary | ICD-10-CM | POA: Diagnosis not present

## 2022-11-16 ENCOUNTER — Ambulatory Visit
Admission: RE | Admit: 2022-11-16 | Discharge: 2022-11-16 | Disposition: A | Discharge status: Home / Self Care | Payer: Medicare Other | Source: Ambulatory Visit | Attending: Emergency Medicine | Admitting: Emergency Medicine

## 2022-11-16 VITALS — BP 118/77 | HR 91 | Temp 98.2°F | Resp 18

## 2022-11-16 DIAGNOSIS — J0100 Acute maxillary sinusitis, unspecified: Secondary | ICD-10-CM | POA: Diagnosis not present

## 2022-11-16 DIAGNOSIS — R051 Acute cough: Secondary | ICD-10-CM | POA: Diagnosis not present

## 2022-11-16 MED ORDER — AMOXICILLIN 875 MG PO TABS: 875.0000 mg | ORAL_TABLET | Freq: Two times a day (BID) | ORAL | 0 refills | Status: AC

## 2022-11-16 NOTE — ED Provider Notes (Signed)
Manuel Harper    CSN: [id number] Arrival date & time: 11/16/22  [id number]      History   Chief Complaint Chief Complaint  Patient presents with   Cough    Cough and chest congestion - Entered by patient    HPI Manuel Harper is a 67 y.o. male.  Patient presents with low grade fever, chills, body aches, cough, congestion, yellow nasal drainage x 6 days.  No fever in the last 24 hours.  He denies chest pain, shortness of breath, or other symptoms.  He was seen by his PCP on 11/14/2022; diagnosed with viral syndrome; treated symptomatically with hydrocodone cough syrup and azelastine nasal spray.     The history is provided by the patient, the spouse and medical records.    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    Past Surgical History:  Procedure Laterality Date   ANTERIOR CRUCIATE LIGAMENT REPAIR Bilateral    KNEE ARTHROSCOPY Bilateral    over yrs   LUMBAR LAMINECTOMY/DECOMPRESSION MICRODISCECTOMY Bilateral 09/08/2014   Procedure: LUMBAR LAMINECTOMY/DECOMPRESSION MICRODISCECTOMY 1 LEVEL;  Surgeon: Hosie Spangle, MD;  Location: [facility];  Service: Neurosurgery;  Laterality: Bilateral;  bilateral L45 lumbar laminotomy and foraminotomy   SPINAL FUSION     TOTAL KNEE ARTHROPLASTY Left 09/14/2018   Procedure: TOTAL KNEE ARTHROPLASTY;  Surgeon: Sydnee Cabal, MD;  Location: [facility];  Service: Orthopedics;  Laterality: Left;       Home Medications    Prior to Admission medications   Medication Sig Start Date End Date Taking? Authorizing Provider  amoxicillin (AMOXIL) 875 MG tablet Take 1 tablet (875 mg total) by mouth 2 (two) times daily for 10 days. 11/16/22  11/26/22 Yes Sharion Balloon, NP  azelastine (ASTELIN) 0.1 % nasal spray 1-2 SPRAYS IN EACH NOSTRIL NASALLY TWICE A DAY   Yes [provider]  bifidobacterium infantis (ALIGN) capsule See admin instructions.   Yes [provider]  diphenhydramine-acetaminophen (TYLENOL PM) 25-500 MG TABS tablet Take 1 tablet by mouth at bedtime as needed (sleep).    Yes [provider]  docusate sodium (COLACE) 100 MG capsule 1 capsule as needed   Yes [provider]  lovastatin (MEVACOR) 40 MG tablet Take 40 mg by mouth daily.  06/22/14  Yes [provider]  OVER THE COUNTER MEDICATION Take 20 mg by mouth daily. CBD oil  1 dropper = 20mg    Yes [provider]  diazepam (VALIUM) 5 MG tablet diazepam 5 mg tablet  TAKE 1 TABLET BY ORAL ROUTE 45 MINUTES PRIOR TO A MAY REPEAT AFTER 20 MINUTES, AS NEEDED 10/20/20   [provider]  gabapentin (NEURONTIN) 300 MG capsule Take 1 capsule by mouth daily. 11/16/20   [provider]  Melatonin 5 MG SUBL melatonin 5 mg sublingual tablet   5 mg every day by sublingual route.    [provider]  meloxicam (MOBIC) 15 MG tablet TAKE 1 TABLET ONCE A DAY AS NEEDED WITH FOOD FOR PAIN    [provider]  montelukast (SINGULAIR) 10 MG tablet Take 1 tablet (10 mg total)  by mouth at bedtime. 11/17/20   Hazel Sams, PA-C  neomycin-polymyxin-hydrocortisone (CORTISPORIN) OTIC solution Apply 1-2 drops to toe after soaking BID 12/14/20   Wallene Huh, DPM  valACYclovir (VALTREX) 1000 MG tablet Take 1,000 mg by mouth 2 (two) times daily as needed (cold sores). For 2 days    [provider]    Family History Family History  Problem Relation Age of Onset   Depression Mother    Lung cancer Father    Alcoholism Brother     Social History Social History   Tobacco Use   Smoking status: Some Days    Types: Cigars   Smokeless tobacco: Never   Tobacco comments:    once a year  Vaping Use    Vaping Use: Never used  Substance Use Topics   Alcohol use: Yes    Alcohol/week: 10.0 standard drinks of alcohol    Types: 5 Glasses of wine, 5 Cans of beer per week    Comment: 5-6 per week   Drug use: No    Comment: occ     Allergies   Atorvastatin and Simvastatin   Review of Systems Review of Systems  Constitutional:  Positive for chills and fever.  HENT:  Positive for congestion, postnasal drip and rhinorrhea. Negative for ear pain and sore throat.   Eyes:  Negative for pain and visual disturbance.  Respiratory:  Positive for cough. Negative for shortness of breath.   Cardiovascular:  Negative for chest pain and palpitations.  Gastrointestinal:  Negative for diarrhea and vomiting.  Skin:  Negative for color change and rash.  All other systems reviewed and are negative.    Physical Exam Triage Vital Signs ED Triage Vitals  Enc Vitals Group     BP      Pulse      Resp      Temp      Temp src      SpO2      Weight      Height      Head Circumference      Peak Flow      Pain Score      Pain Loc      Pain Edu?      Excl. in [place]?    No data found.  Updated Vital Signs BP 118/77 (BP Location: Right Arm)   Pulse 91   Temp 98.2 F (36.8 C) (Oral)   Resp 18   SpO2 96%   Visual Acuity Right Eye Distance:   Left Eye Distance:   Bilateral Distance:    Right Eye Near:   Left Eye Near:    Bilateral Near:     Physical Exam Vitals and nursing note reviewed.  Constitutional:      General: He is not in acute distress.    Appearance: Normal appearance. He is well-developed. He is not ill-appearing.  HENT:     Right Ear: Tympanic membrane normal.     Left Ear: Tympanic membrane normal.     Nose: Congestion present.     Mouth/Throat:     Mouth: Mucous membranes are moist.     Pharynx: Oropharynx is clear.  Cardiovascular:     Rate and Rhythm: Normal rate and regular rhythm.     Heart sounds: Normal heart sounds.  Pulmonary:     Effort: Pulmonary effort  is normal. No respiratory distress.     Breath sounds: Normal breath sounds.  Musculoskeletal:     Cervical back: Neck  supple.  Skin:    General: Skin is warm and dry.  Neurological:     Mental Status: He is alert.  Psychiatric:        Mood and Affect: Mood normal.        Behavior: Behavior normal.      UC Treatments / Results  Labs (all labs ordered are listed, but only abnormal results are displayed) Labs Reviewed - No data to display  EKG   Radiology No results found.  Procedures Procedures (including critical care time)  Medications Ordered in UC Medications - No data to display  Initial Impression / Assessment and Plan / UC Course  I have reviewed the triage vital signs and the nursing notes.  Pertinent labs & imaging results that were available during my care of the patient were reviewed by me and considered in my medical decision making (see chart for details).    Acute sinusitis, cough.  Afebrile and vital signs are stable.  No respiratory distress, lungs are clear, O2 sat 96% on room air.  Treating with amoxicillin.  Discussed symptomatic treatment including Tylenol, plain Mucinex, Delsym.  Instructed patient to follow up with his PCP.  Education provided on sinusitis and cough.  He agrees to plan of care.   Final Clinical Impressions(s) / UC Diagnoses   Final diagnoses:  Acute non-recurrent maxillary sinusitis  Acute cough     Discharge Instructions      Take the amoxicillin as directed.  Follow up with your primary care provider.        ED Prescriptions     Medication Sig Dispense Auth. Provider   amoxicillin (AMOXIL) 875 MG tablet Take 1 tablet (875 mg total) by mouth 2 (two) times daily for 10 days. 20 tablet Sharion Balloon, NP      PDMP not reviewed this encounter.   Sharion Balloon, NP 11/16/22 1036

## 2022-11-16 NOTE — ED Triage Notes (Signed)
Cough, runny nose, fever 100.7 two days ago, body aches, chills,that started 6 days ago. Home Covid test negative. Went to PCP Monday and was prescribed tussionex, has been helping but still having cough that wont go away.

## 2022-11-16 NOTE — Discharge Instructions (Addendum)
Take the amoxicillin as directed.  Follow up with your primary care provider.   ° °

## 2022-11-17 DIAGNOSIS — M25561 Pain in right knee: Secondary | ICD-10-CM | POA: Diagnosis not present

## 2022-11-29 DIAGNOSIS — M24661 Ankylosis, right knee: Secondary | ICD-10-CM | POA: Diagnosis not present

## 2022-11-29 DIAGNOSIS — Z96651 Presence of right artificial knee joint: Secondary | ICD-10-CM | POA: Diagnosis not present

## 2022-11-29 DIAGNOSIS — T8482XA Fibrosis due to internal orthopedic prosthetic devices, implants and grafts, initial encounter: Secondary | ICD-10-CM | POA: Diagnosis not present

## 2022-11-29 DIAGNOSIS — G8918 Other acute postprocedural pain: Secondary | ICD-10-CM | POA: Diagnosis not present

## 2022-11-30 DIAGNOSIS — M25561 Pain in right knee: Secondary | ICD-10-CM | POA: Diagnosis not present

## 2022-11-30 DIAGNOSIS — M25661 Stiffness of right knee, not elsewhere classified: Secondary | ICD-10-CM | POA: Diagnosis not present

## 2022-12-01 DIAGNOSIS — M25661 Stiffness of right knee, not elsewhere classified: Secondary | ICD-10-CM | POA: Diagnosis not present

## 2022-12-01 DIAGNOSIS — M25561 Pain in right knee: Secondary | ICD-10-CM | POA: Diagnosis not present

## 2022-12-02 DIAGNOSIS — M25661 Stiffness of right knee, not elsewhere classified: Secondary | ICD-10-CM | POA: Diagnosis not present

## 2022-12-02 DIAGNOSIS — M25561 Pain in right knee: Secondary | ICD-10-CM | POA: Diagnosis not present

## 2022-12-05 DIAGNOSIS — M25661 Stiffness of right knee, not elsewhere classified: Secondary | ICD-10-CM | POA: Diagnosis not present

## 2022-12-05 DIAGNOSIS — M25561 Pain in right knee: Secondary | ICD-10-CM | POA: Diagnosis not present

## 2022-12-06 DIAGNOSIS — M25561 Pain in right knee: Secondary | ICD-10-CM | POA: Diagnosis not present

## 2022-12-06 DIAGNOSIS — M25661 Stiffness of right knee, not elsewhere classified: Secondary | ICD-10-CM | POA: Diagnosis not present

## 2022-12-07 DIAGNOSIS — L821 Other seborrheic keratosis: Secondary | ICD-10-CM | POA: Diagnosis not present

## 2022-12-07 DIAGNOSIS — Z85828 Personal history of other malignant neoplasm of skin: Secondary | ICD-10-CM | POA: Diagnosis not present

## 2022-12-07 DIAGNOSIS — D225 Melanocytic nevi of trunk: Secondary | ICD-10-CM | POA: Diagnosis not present

## 2022-12-07 DIAGNOSIS — L570 Actinic keratosis: Secondary | ICD-10-CM | POA: Diagnosis not present

## 2022-12-08 DIAGNOSIS — M25661 Stiffness of right knee, not elsewhere classified: Secondary | ICD-10-CM | POA: Diagnosis not present

## 2022-12-09 DIAGNOSIS — M25661 Stiffness of right knee, not elsewhere classified: Secondary | ICD-10-CM | POA: Diagnosis not present

## 2022-12-12 DIAGNOSIS — M25561 Pain in right knee: Secondary | ICD-10-CM | POA: Diagnosis not present

## 2022-12-12 DIAGNOSIS — M25661 Stiffness of right knee, not elsewhere classified: Secondary | ICD-10-CM | POA: Diagnosis not present

## 2022-12-13 DIAGNOSIS — M25661 Stiffness of right knee, not elsewhere classified: Secondary | ICD-10-CM | POA: Diagnosis not present

## 2022-12-13 DIAGNOSIS — H00011 Hordeolum externum right upper eyelid: Secondary | ICD-10-CM | POA: Diagnosis not present

## 2022-12-16 DIAGNOSIS — M25661 Stiffness of right knee, not elsewhere classified: Secondary | ICD-10-CM | POA: Diagnosis not present

## 2022-12-20 DIAGNOSIS — M25561 Pain in right knee: Secondary | ICD-10-CM | POA: Diagnosis not present

## 2022-12-20 DIAGNOSIS — M25661 Stiffness of right knee, not elsewhere classified: Secondary | ICD-10-CM | POA: Diagnosis not present

## 2022-12-21 DIAGNOSIS — M25661 Stiffness of right knee, not elsewhere classified: Secondary | ICD-10-CM | POA: Diagnosis not present

## 2022-12-21 DIAGNOSIS — M25561 Pain in right knee: Secondary | ICD-10-CM | POA: Diagnosis not present

## 2022-12-27 DIAGNOSIS — M25561 Pain in right knee: Secondary | ICD-10-CM | POA: Diagnosis not present

## 2022-12-27 DIAGNOSIS — M25661 Stiffness of right knee, not elsewhere classified: Secondary | ICD-10-CM | POA: Diagnosis not present

## 2022-12-29 DIAGNOSIS — M25661 Stiffness of right knee, not elsewhere classified: Secondary | ICD-10-CM | POA: Diagnosis not present

## 2022-12-29 DIAGNOSIS — M25561 Pain in right knee: Secondary | ICD-10-CM | POA: Diagnosis not present

## 2023-01-03 DIAGNOSIS — Z4789 Encounter for other orthopedic aftercare: Secondary | ICD-10-CM | POA: Diagnosis not present

## 2023-01-12 DIAGNOSIS — M25661 Stiffness of right knee, not elsewhere classified: Secondary | ICD-10-CM | POA: Diagnosis not present

## 2023-01-12 DIAGNOSIS — M25561 Pain in right knee: Secondary | ICD-10-CM | POA: Diagnosis not present

## 2023-01-24 DIAGNOSIS — I491 Atrial premature depolarization: Secondary | ICD-10-CM | POA: Diagnosis not present

## 2023-01-24 DIAGNOSIS — R42 Dizziness and giddiness: Secondary | ICD-10-CM | POA: Diagnosis not present

## 2023-01-24 DIAGNOSIS — E7800 Pure hypercholesterolemia, unspecified: Secondary | ICD-10-CM | POA: Diagnosis not present

## 2023-02-22 DIAGNOSIS — J019 Acute sinusitis, unspecified: Secondary | ICD-10-CM | POA: Diagnosis not present

## 2023-02-22 DIAGNOSIS — R059 Cough, unspecified: Secondary | ICD-10-CM | POA: Diagnosis not present

## 2023-02-22 DIAGNOSIS — K219 Gastro-esophageal reflux disease without esophagitis: Secondary | ICD-10-CM | POA: Diagnosis not present

## 2023-02-22 DIAGNOSIS — R519 Headache, unspecified: Secondary | ICD-10-CM | POA: Diagnosis not present

## 2023-02-23 DIAGNOSIS — E7800 Pure hypercholesterolemia, unspecified: Secondary | ICD-10-CM | POA: Diagnosis not present

## 2023-03-15 DIAGNOSIS — R197 Diarrhea, unspecified: Secondary | ICD-10-CM | POA: Diagnosis not present

## 2023-03-15 DIAGNOSIS — R109 Unspecified abdominal pain: Secondary | ICD-10-CM | POA: Diagnosis not present

## 2023-03-16 DIAGNOSIS — R197 Diarrhea, unspecified: Secondary | ICD-10-CM | POA: Diagnosis not present

## 2023-03-21 DIAGNOSIS — H00011 Hordeolum externum right upper eyelid: Secondary | ICD-10-CM | POA: Diagnosis not present

## 2023-05-08 DIAGNOSIS — A09 Infectious gastroenteritis and colitis, unspecified: Secondary | ICD-10-CM | POA: Diagnosis not present

## 2023-05-08 DIAGNOSIS — K219 Gastro-esophageal reflux disease without esophagitis: Secondary | ICD-10-CM | POA: Diagnosis not present

## 2023-05-24 DIAGNOSIS — Z4789 Encounter for other orthopedic aftercare: Secondary | ICD-10-CM | POA: Diagnosis not present

## 2023-05-31 DIAGNOSIS — K219 Gastro-esophageal reflux disease without esophagitis: Secondary | ICD-10-CM | POA: Diagnosis not present

## 2023-05-31 DIAGNOSIS — Z0000 Encounter for general adult medical examination without abnormal findings: Secondary | ICD-10-CM | POA: Diagnosis not present

## 2023-05-31 DIAGNOSIS — Z1331 Encounter for screening for depression: Secondary | ICD-10-CM | POA: Diagnosis not present

## 2023-05-31 DIAGNOSIS — Z23 Encounter for immunization: Secondary | ICD-10-CM | POA: Diagnosis not present

## 2023-05-31 DIAGNOSIS — I491 Atrial premature depolarization: Secondary | ICD-10-CM | POA: Diagnosis not present

## 2023-05-31 DIAGNOSIS — Z125 Encounter for screening for malignant neoplasm of prostate: Secondary | ICD-10-CM | POA: Diagnosis not present

## 2023-05-31 DIAGNOSIS — Z1159 Encounter for screening for other viral diseases: Secondary | ICD-10-CM | POA: Diagnosis not present

## 2023-05-31 DIAGNOSIS — E7800 Pure hypercholesterolemia, unspecified: Secondary | ICD-10-CM | POA: Diagnosis not present

## 2023-06-01 ENCOUNTER — Other Ambulatory Visit (HOSPITAL_BASED_OUTPATIENT_CLINIC_OR_DEPARTMENT_OTHER): Payer: Self-pay | Admitting: Family Medicine

## 2023-06-01 DIAGNOSIS — E7800 Pure hypercholesterolemia, unspecified: Secondary | ICD-10-CM

## 2023-06-08 ENCOUNTER — Ambulatory Visit (HOSPITAL_COMMUNITY)
Admission: RE | Admit: 2023-06-08 | Discharge: 2023-06-08 | Disposition: A | Discharge status: Home / Self Care | Payer: Medicare Other | Source: Ambulatory Visit | Attending: Family Medicine | Admitting: Family Medicine

## 2023-06-08 DIAGNOSIS — E7800 Pure hypercholesterolemia, unspecified: Secondary | ICD-10-CM | POA: Insufficient documentation

## 2023-06-09 ENCOUNTER — Other Ambulatory Visit (HOSPITAL_COMMUNITY): Payer: Medicare Other

## 2023-07-31 ENCOUNTER — Other Ambulatory Visit: Payer: Self-pay | Admitting: Medical Genetics

## 2023-08-22 ENCOUNTER — Ambulatory Visit (INDEPENDENT_AMBULATORY_CARE_PROVIDER_SITE_OTHER): Payer: Medicare Other | Admitting: Allergy & Immunology

## 2023-08-22 ENCOUNTER — Other Ambulatory Visit: Payer: Self-pay

## 2023-08-22 ENCOUNTER — Encounter: Payer: Self-pay | Admitting: Allergy & Immunology

## 2023-08-22 VITALS — BP 128/78 | HR 94 | Temp 98.7°F | Resp 18 | Ht 71.0 in | Wt 181.0 lb

## 2023-08-22 DIAGNOSIS — K219 Gastro-esophageal reflux disease without esophagitis: Secondary | ICD-10-CM

## 2023-08-22 DIAGNOSIS — J310 Chronic rhinitis: Secondary | ICD-10-CM | POA: Diagnosis not present

## 2023-08-22 MED ORDER — MONTELUKAST SODIUM 10 MG PO TABS: 10.0000 mg | ORAL_TABLET | Freq: Every day | ORAL | 1 refills | Status: AC

## 2023-08-22 MED ORDER — CARBINOXAMINE MALEATE 4 MG PO TABS: 4.0000 mg | ORAL_TABLET | Freq: Three times a day (TID) | ORAL | 0 refills | Status: AC

## 2023-08-22 NOTE — Patient Instructions (Addendum)
 1. Chronic rhinitis - Because of insurance stipulations, we cannot do skin testing on the same day as your first visit. - We are all working to fight this, but for now we need to do two separate visits.  - We will know more after we do testing at the next visit.  - The skin testing visit can be squeezed in at your convenience.  - Then we can make a more full plan to address all of your symptoms. - Be sure to stop your antihistamines for 3 days before this appointment.   - In the meantime, start Singulair  (montelukast ) 10mg  daily to see if this helps with your rhinitis symptoms. - Singulair  can cause irritability and bad dreams as a rare side effect, so beware of this.  - Add on carbinoxamine  4mg  (similar to Benadryl ) to see if this can help the mucous production.   2. Gastroesophageal reflux disease - Continue with the Protonix  1-2 times daily.   3. Return in about 1 week (around 08/29/2023) for SKIN TESTING (1-55). You can have the follow up appointment with Dr. Iva or a Nurse Practicioner (our Nurse Practitioners are excellent and always have Physician oversight!).    Please inform us  of any Emergency Department visits, hospitalizations, or changes in symptoms. Call us  before going to the ED for breathing or allergy  symptoms since we might be able to fit you in for a sick visit. Feel free to contact us  anytime with any questions, problems, or concerns.  It was a pleasure to meet you today!  Websites that have reliable patient information: 1. American Academy of Asthma, Allergy , and Immunology: www.aaaai.org 2. Food Allergy  Research and Education (FARE): foodallergy.org 3. Mothers of Asthmatics: http://www.asthmacommunitynetwork.org 4. [employer] of Allergy , Asthma, and Immunology: www.acaai.org      "Like" us  on Facebook and Instagram for our latest updates!      A healthy democracy works best when [employer] participate! Make sure you are registered to vote! If you  have moved or changed any of your contact information, you will need to get this updated before voting! Scan the QR codes below to learn more!

## 2023-08-22 NOTE — Progress Notes (Signed)
 NEW PATIENT  Date of Service/Encounter:  08/22/23  Consult requested by: Manuel Cole, MD   Assessment:   Chronic rhinitis - planning for skin testing at the next visit  S/p nasal sinus endoscopy (2023)  GERD - on PPI long term (at least once daily)  Plan/Recommendations:   1. Chronic rhinitis - Because of insurance stipulations, we cannot do skin testing on the same day as your first visit. - We are all working to fight this, but for now we need to do two separate visits.  - We will know more after we do testing at the next visit.  - The skin testing visit can be squeezed in at your convenience.  - Then we can make a more full plan to address all of your symptoms. - Be sure to stop your antihistamines for 3 days before this appointment.   - In the meantime, start Singulair  (montelukast ) 10mg  daily to see if this helps with your rhinitis symptoms. - Singulair  can cause irritability and bad dreams as a rare side effect, so beware of this.  - Add on carbinoxamine  4mg  (similar to Benadryl ) to see if this can help the mucous production.   2. Gastroesophageal reflux disease - Continue with the Protonix  1-2 times daily.   3. Return in about 1 week (around 08/29/2023) for SKIN TESTING (1-55). You can have the follow up appointment with Dr. Iva or a Nurse Practicioner (our Nurse Practitioners are excellent and always have Physician oversight!).    This note in its entirety was forwarded to the Provider who requested this consultation.  Subjective:   Manuel Harper is a 68 y.o. male presenting today for evaluation of  Chief Complaint  Patient presents with   Establish Care    Excessive mucous in the throat.    Manuel Harper has a history of the following: Patient Active Problem List   Diagnosis Date Noted   S/P knee replacement 09/14/2018   Lumbar stenosis with neurogenic claudication 09/08/2014    History obtained from: chart review and patient.  Discussed the  use of AI scribe software for clinical note transcription with the patient and/or guardian, who gave verbal consent to proceed.  Manuel Harper was referred by Manuel Cole, MD.     Manuel Harper is a 68 y.o. male presenting for an evaluation of environmental allergies and postnasal drip .  Manuel Harper has a longstanding history of throat clearing, reports that the symptom has been present for over three decades, with a recent increase in severity. The throat clearing is described as a daily, year-round occurrence, with no relief from over-the-counter antihistamines or nasal sprays such as azelastine and Flonase. The patient also reports a lack of improvement with Mucinex. The patient has a history of allergy  testing, but this was over 30 years ago and no significant findings were reported. He has not had any recent endoscopic evaluations.  He certainly has never been on any allergy  shots.  His last visit with Dr. Carlie was in December 2023.  At that time, they discussed other causes of this throat clearing.  He recommended doing twice daily dosing of a PPI and reflux modifications.  In the note, Dr. Carlie did recommend involving his GI doctor today to perform a dual channel pH probe.  The patient's symptoms seem to worsen with anxiety, such as before singing in a choir, and possibly with exposure to certain foods and drinks, although this is not consistent. The patient has not noticed any improvement  with saline rinses, but does report a temporary relief with hot drinks like tea and coffee.  The patient denies any sinus pressure or frequent sinus infections, with only a couple of instances per year.  The last one in the system was from April 2024 diagnosed at urgent care.  He underwent sinus surgery in January 2023, with the intention of improving the efficacy of nasal medications, but reports no improvement or even a worsening of symptoms post-surgery.  He has been on pantoprazole  for suspected reflux, but has been  inconsistent with the twice-daily dosing. He reports no noticeable change in symptoms with the medication.  He also mentions a remote significant episode of nosebleed that required an emergency room visit, which occurred after exposure to a moldy environment. However, he does not believe this is related to the current throat clearing issue.  The patient denies any breathing issues, skin problems, or family history of allergies or asthma. He has no domestic animals and has not noticed any change in symptoms with travel or change in environment.     Otherwise, there is no history of other atopic diseases, including drug allergies, stinging insect allergies, or contact dermatitis. There is no significant infectious history. Vaccinations are up to date.    Past Medical History: Patient Active Problem List   Diagnosis Date Noted   S/P knee replacement 09/14/2018   Lumbar stenosis with neurogenic claudication 09/08/2014    Medication List:  Allergies as of 08/22/2023       Reactions   Atorvastatin    Other Reaction(s): muscle aches   Simvastatin Other (See Comments)   Other Reaction(s): muscle aches        Medication List        Accurate as of August 22, 2023 11:59 PM. If you have any questions, ask your nurse or doctor.          STOP taking these medications    azelastine 0.1 % nasal spray Commonly known as: ASTELIN Stopped by: Marty Morton Shaggy   bifidobacterium infantis capsule Stopped by: Marty Morton Shaggy   diazepam 5 MG tablet Commonly known as: VALIUM Stopped by: Marty Morton Shaggy   gabapentin 300 MG capsule Commonly known as: NEURONTIN Stopped by: Anitta Tenny Louis Brittanie Dosanjh   lovastatin 40 MG tablet Commonly known as: MEVACOR Stopped by: Lillie Bollig Louis Jayceion Lisenby   Melatonin 5 MG Subl Stopped by: Derius Ghosh Louis Oona Trammel   meloxicam 15 MG tablet Commonly known as: MOBIC Stopped by: Marty Morton Shaggy   neomycin -polymyxin-hydrocortisone OTIC  solution Commonly known as: CORTISPORIN Stopped by: Marty Morton Shaggy   OVER THE COUNTER MEDICATION Stopped by: Marty Morton Shaggy       TAKE these medications    Carbinoxamine  Maleate 4 MG Tabs Take 1 tablet (4 mg total) by mouth every 8 (eight) hours. Started by: Marty Morton Shaggy   Colace 100 MG capsule Generic drug: docusate sodium  1 capsule as needed   diphenhydramine -acetaminophen  25-500 MG Tabs tablet Commonly known as: TYLENOL  PM Take 1 tablet by mouth at bedtime as needed (sleep).   ezetimibe 10 MG tablet Commonly known as: ZETIA Take 10 mg by mouth daily.   fluticasone 50 MCG/ACT nasal spray Commonly known as: FLONASE Place 2 sprays into both nostrils daily.   hyoscyamine 0.125 MG SL tablet Commonly known as: LEVSIN SL Take 0.125 mg by mouth 3 (three) times daily as needed for cramping.   IBgard 90 MG Cpcr Generic drug: Peppermint Oil 2 capsules Orally Once a day   montelukast   10 MG tablet Commonly known as: Singulair  Take 1 tablet (10 mg total) by mouth at bedtime.   Mucinex 600 MG 12 hr tablet Generic drug: guaiFENesin Take 600 mg by mouth 2 (two) times daily.   Multi Vitamin Tabs 1 tablet Orally Once a day   pantoprazole  40 MG tablet Commonly known as: PROTONIX  Take 40 mg by mouth daily.   valACYclovir 1000 MG tablet Commonly known as: VALTREX Take 1,000 mg by mouth 2 (two) times daily as needed (cold sores). For 2 days        Birth History: non-contributory  Developmental History: non-contributory  Past Surgical History: Past Surgical History:  Procedure Laterality Date   ANTERIOR CRUCIATE LIGAMENT REPAIR Bilateral    KNEE ARTHROSCOPY Bilateral    over yrs   LUMBAR LAMINECTOMY/DECOMPRESSION MICRODISCECTOMY Bilateral 09/08/2014   Procedure: LUMBAR LAMINECTOMY/DECOMPRESSION MICRODISCECTOMY 1 LEVEL;  Surgeon: Lamar LELON Peaches, MD;  Location: [facility];  Service: Neurosurgery;  Laterality: Bilateral;  bilateral L45 lumbar  laminotomy and foraminotomy   SINOSCOPY     SPINAL FUSION     TOTAL KNEE ARTHROPLASTY Left 09/14/2018   Procedure: TOTAL KNEE ARTHROPLASTY;  Surgeon: Gerome Lamar, MD;  Location: [facility];  Service: Orthopedics;  Laterality: Left;     Family History: Family History  Problem Relation Age of Onset   Depression Mother    Asthma Father    Lung cancer Father    Alcoholism Brother      Social History: Nael lives at home with his wife.  They live in a house that is 61 years old.  There is wood with rugs in the main living areas and carpeting in the bedroom.  They have gas heating and central cooling.  There are no animals inside or outside of the home.  There are no dust mite covers on the bedding.  There is no tobacco exposure.  He is currently retired.  There is no fume, chemical, or dust exposure.  There is no HEPA filter.  [place] not [place] interstate or industrial area.   Review of systems otherwise negative other than that mentioned in the HPI.    Objective:   Blood pressure 128/78, pulse 94, temperature 98.7 F (37.1 C), temperature source Temporal, resp. rate 18, height 5' 11 (1.803 m), weight 181 lb (82.1 kg), SpO2 96%. Body mass index is 25.24 kg/m.     Physical Exam Vitals reviewed.  Constitutional:      Appearance: He is well-developed.  HENT:     Head: Normocephalic and atraumatic.     Comments: Pleasant.  Cooperative with exam.    Right Ear: Tympanic membrane, ear canal and external ear normal. No drainage, swelling or tenderness. Tympanic membrane is not injected, scarred, erythematous, retracted or bulging.     Left Ear: Tympanic membrane, ear canal and external ear normal. No drainage, swelling or tenderness. Tympanic membrane is not injected, scarred, erythematous, retracted or bulging.     Nose: No nasal deformity, septal deviation, mucosal edema or rhinorrhea.     Right Turbinates: Enlarged, swollen and pale.     Left Turbinates: Enlarged, swollen  and pale.     Right Sinus: No maxillary sinus tenderness or frontal sinus tenderness.     Left Sinus: No maxillary sinus tenderness or frontal sinus tenderness.     Mouth/Throat:     Mouth: Mucous membranes are not pale and not dry.     Pharynx: Uvula midline.  Eyes:     General:  Right eye: No discharge.        Left eye: No discharge.     Conjunctiva/sclera: Conjunctivae normal.     Right eye: Right conjunctiva is not injected. No chemosis.    Left eye: Left conjunctiva is not injected. No chemosis.    Pupils: Pupils are equal, round, and reactive to light.  Cardiovascular:     Rate and Rhythm: Normal rate and regular rhythm.     Heart sounds: Normal heart sounds.  Pulmonary:     Effort: Pulmonary effort is normal. No tachypnea, accessory muscle usage or respiratory distress.     Breath sounds: Normal breath sounds. No wheezing, rhonchi or rales.     Comments: Moving air well in all lung fields.  No increased work of breathing. Chest:     Chest wall: No tenderness.  Abdominal:     Tenderness: There is no abdominal tenderness. There is no guarding or rebound.  Lymphadenopathy:     Head:     Right side of head: No submandibular, tonsillar or occipital adenopathy.     Left side of head: No submandibular, tonsillar or occipital adenopathy.     Cervical: No cervical adenopathy.  Skin:    Coloration: Skin is not pale.     Findings: No abrasion, erythema, petechiae or rash. Rash is not papular, urticarial or vesicular.  Neurological:     Mental Status: He is alert.  Psychiatric:        Behavior: Behavior is cooperative.      Diagnostic studies: deferred due to insurance stipulations that require a separate visit for testing          Marty Shaggy, MD [facility] 

## 2023-08-24 ENCOUNTER — Encounter: Payer: Self-pay | Admitting: Allergy & Immunology

## 2023-08-24 ENCOUNTER — Other Ambulatory Visit (HOSPITAL_COMMUNITY)
Admission: RE | Admit: 2023-08-24 | Discharge: 2023-08-24 | Disposition: A | Discharge status: Home / Self Care | Payer: Self-pay | Source: Ambulatory Visit | Attending: Oncology | Admitting: Oncology

## 2023-08-31 ENCOUNTER — Ambulatory Visit: Payer: Medicare Other | Admitting: Allergy & Immunology

## 2023-08-31 ENCOUNTER — Encounter: Payer: Self-pay | Admitting: Allergy & Immunology

## 2023-08-31 DIAGNOSIS — J310 Chronic rhinitis: Secondary | ICD-10-CM

## 2023-08-31 DIAGNOSIS — K219 Gastro-esophageal reflux disease without esophagitis: Secondary | ICD-10-CM

## 2023-08-31 NOTE — Progress Notes (Signed)
FOLLOW UP  Date of Service/Encounter:  08/31/23   Assessment:   Non-allergic rhinitis   Gastroesophageal reflux disease  Plan/Recommendations:   1. Chronic non-allergic rhinitis - Testing today showed: NEGATIVE TO THE ENTIRE PANEL - Copy of test results provided.  - Continue with: Singulair (montelukast) 10mg  daily and Flonase (fluticasone) one spray per nostril daily (AIM FOR EAR ON EACH SIDE) - Let's give this some more time to work.  - We can tweak medications in the future.   2. Gastroesophageal reflux disease - Continue with the Protonix 1-2 times daily.   3. Return in about 3 months (around 11/29/2023). You can have the follow up appointment with Dr. Dellis Harper or a Nurse Practicioner (our Nurse Practitioners are excellent and always have Physician oversight!).   Subjective:   Manuel Harper is a 68 y.o. male presenting today for follow up of No chief complaint on file.   Manuel Harper has a history of the following: Patient Active Problem List   Diagnosis Date Noted   S/P knee replacement 09/14/2018   Lumbar stenosis with neurogenic claudication 09/08/2014    History obtained from: chart review and patient.  Discussed the use of AI scribe software for clinical note transcription with the patient and/or guardian, who gave verbal consent to proceed.  Manuel Harper is a 68 y.o. male presenting for skin testing. He was last seen on January 7. We could not do testing because his insurance company does not cover testing on the same day as a New Patient visit. He has been off of all antihistamines 3 days in anticipation of the testing.   We started him on Singulair at the visit.  He reports that he took it for around 3 days and actually did feel a little bit better.  He did have some poor sleep the first night that he took it, which he thinks might be related to the drug itself.  He stopped it for the testing today.  We did add on carbinoxamine 4 mg daily as well to see if  this would help with postnasal drip.  He was not a huge fan of no sprays.  He remained on the Protonix 1-2 times daily.  Otherwise, there have been no changes to his past medical history, surgical history, family history, or social history.    Review of systems otherwise negative other than that mentioned in the HPI.    Objective:   There were no vitals taken for this visit. There is no height or weight on file to calculate BMI.    Physical exam deferred since this was a skin testing appointment only.   Diagnostic studies:    Allergy Studies:     Airborne Adult Perc - 08/31/23 0933     Time Antigen Placed 0933    Allergen Manufacturer Waynette Buttery    Location Back    Number of Test 55    1. Control-Buffer 50% Glycerol Negative    2. Control-Histamine 2+    3. Bahia Negative    4. French Southern Territories Negative    5. Johnson Negative    6. Kentucky Blue Negative    7. Meadow Fescue Negative    8. Perennial Rye Negative    9. Timothy Negative    10. Ragweed Mix Negative    11. Cocklebur Negative    12. Plantain,  English Negative    13. Baccharis Negative    14. Dog Fennel Negative    15. Russian Thistle Negative    16.  Lamb's Quarters Negative    17. Sheep Sorrell Negative    18. Rough Pigweed Negative    19. Marsh Elder, Rough Negative    20. Mugwort, Common Negative    21. Box, Elder Negative    22. Cedar, red Negative    23. Sweet Gum Negative    24. Pecan Pollen Negative    25. Pine Mix Negative    26. Walnut, Black Pollen Negative    27. Red Mulberry Negative    28. Ash Mix Negative    29. Birch Mix Negative    30. Beech American Negative    31. Cottonwood, Guinea[place]iss[place] Negative    32. Hickory, White Negative    33. Maple Mix Negative    34. Oak, [place] Mix Negative    35. Sycamore Eastern Negative    36. Alternaria Alternata Negative    37. Cladosporium Herbarum Negative    38. Aspergillus Mix Negative    39. Penicillium Mix Negative    40. Bipolaris Sorokiniana  (Helminthosporium) Negative    41. Drechslera Spicifera (Curvularia) Negative    42. Mucor Plumbeus Negative    43. Fusarium Moniliforme Negative    44. Aureobasidium Pullulans (pullulara) Negative    45. Rhizopus Oryzae Negative    46. Botrytis Cinera Negative    47. Epicoccum Nigrum Negative    48. Phoma Betae Negative    49. Dust Mite Mix Negative    50. Cat Hair 10,000 BAU/ml Negative    51.  Dog Epithelia Negative    52. Mixed Feathers Negative    53. Horse Epithelia Negative    54. Cockroach, German Negative    55. Tobacco Leaf Negative             Intradermal - 08/31/23 1005     Time Antigen Placed 1005    Allergen Manufacturer Greer    Location Arm    Number of Test 16    Control Negative    Bahia Negative    French Southern Territories Negative    Johnson Negative    7 Grass Negative    Ragweed Mix Negative    Weed Mix Negative    Tree Mix Negative    Mold 1 Negative    Mold 2 Negative    Mold 3 Negative    Mold 4 Negative    Mite Mix Negative    Cat Negative    Dog Negative    Cockroach Negative             Allergy testing results were read and interpreted by myself, documented by clinical staff.      Manuel Bonds, MD  [facility]

## 2023-08-31 NOTE — Patient Instructions (Addendum)
1. Chronic non-allergic rhinitis - Testing today showed: NEGATIVE TO THE ENTIRE PANEL - Copy of test results provided.  - Continue with: Singulair (montelukast) 10mg  daily and Flonase (fluticasone) one spray per nostril daily (AIM FOR EAR ON EACH SIDE) - Let's give this some more time to work.  - We can tweak medications in the future.   2. Gastroesophageal reflux disease - Continue with the Protonix 1-2 times daily.   3. Return in about 3 months (around 11/29/2023). You can have the follow up appointment with Dr. Dellis Anes or a Nurse Practicioner (our Nurse Practitioners are excellent and always have Physician oversight!).    Please inform us of any Emergency Department visits, hospitalizations, or changes in symptoms. Call us before going to the ED for breathing or allergy symptoms since we might be able to fit you in for a sick visit. Feel free to contact us anytime with any questions, problems, or concerns.  It was a pleasure to meet you today!  Websites that have reliable patient information: 1. American Academy of Asthma, Allergy, and Immunology: www.aaaai.org 2. Food Allergy Research and Education (FARE): foodallergy.org 3. Mothers of Asthmatics: http://www.asthmacommunitynetwork.org 4. American College of Allergy, Asthma, and Immunology: www.acaai.org      "Like" [place] on Facebook and Instagram for our latest updates!      A healthy democracy works best when [employer] participate! Make sure you are registered to vote! If you have moved or changed any of your contact information, you will need to get this updated before voting! Scan the QR codes below to learn more!         Airborne Adult Perc - 08/31/23 0933     Time Antigen Placed 0933    Allergen Manufacturer Waynette Buttery    Location Back    Number of Test 55    1. Control-Buffer 50% Glycerol Negative    2. Control-Histamine 2+    3. Bahia Negative    4. French [place] Territ[place] Negative    5. Johnson Negative    6. Kentucky Blue  Negative    7. Meadow Fescue Negative    8. Perennial Rye Negative    9. Timothy Negative    10. Ragweed Mix Negative    11. Cocklebur Negative    12. Plantain,  English Negative    13. Baccharis Negative    14. Dog Fennel Negative    15. Russian Thistle Negative    16. Lamb's Quarters Negative    17. Sheep Sorrell Negative    18. Rough Pigweed Negative    19. Marsh Elder, Rough Negative    20. Mugwort, Common Negative    21. Box, Elder Negative    22. Cedar, red Negative    23. Sweet Gum Negative    24. Pecan Pollen Negative    25. Pine Mix Negative    26. Walnut, Black Pollen Negative    27. Red Mulberry Negative    28. Ash Mix Negative    29. Birch Mix Negative    30. Beech American Negative    31. Cottonwood, [place]iss[place] Negative    32. Hickory, White Negative    33. Maple Mix Negative    34. Oak, [place] Mix Negative    35. Sycamore Eastern Negative    36. Alternaria Alternata Negative    37. Cladosporium Herbarum Negative    38. Aspergillus Mix Negative    39. Penicillium Mix Negative    40. Bipolaris Sorokiniana (Helminthosporium) Negative    41. Drechslera Spicifera (Curvularia) Negative  42. Mucor Plumbeus Negative    43. Fusarium Moniliforme Negative    44. Aureobasidium Pullulans (pullulara) Negative    45. Rhizopus Oryzae Negative    46. Botrytis Cinera Negative    47. Epicoccum Nigrum Negative    48. Phoma Betae Negative    49. Dust Mite Mix Negative    50. Cat Hair 10,000 BAU/ml Negative    51.  Dog Epithelia Negative    52. Mixed Feathers Negative    53. Horse Epithelia Negative    54. Cockroach, German Negative    55. Tobacco Leaf Negative             Intradermal - 08/31/23 1005     Time Antigen Placed 1005    Allergen Manufacturer Greer    Location Arm    Number of Test 16    Control Negative    Bahia Negative    French [place] Territ[place] Negative    Johnson Negative    7 Grass Negative    Ragweed Mix Negative    Weed Mix Negative    Tree Mix Negative     Mold 1 Negative    Mold 2 Negative    Mold 3 Negative    Mold 4 Negative    Mite Mix Negative    Cat Negative    Dog Negative    Cockroach Negative            Rhinitis (Hayfever) Overview  There are two types of rhinitis: allergic and non-allergic.  Allergic Rhinitis If you have allergic rhinitis, your immune system mistakenly identifies a typically harmless substance as an intruder. This substance is called an allergen. The immune system responds to the allergen by releasing histamine and chemical mediators that typically cause symptoms in the nose, throat, eyes, ears, skin and roof of the mouth.  Seasonal allergic rhinitis (hay fever) is most often caused by pollen carried in the air during different times of the year in different parts of the country.  Allergic rhinitis can also be triggered by common indoor allergens such as the dried skin flakes, urine and saliva found on pet dander, mold, droppings from dust mites and cockroach particles. This is called perennial allergic rhinitis, as symptoms typically occur year-round.  In addition to allergen triggers, symptoms may also occur from irritants such as smoke and strong odors, or to changes in the temperature and humidity of the air. This happens because allergic rhinitis causes inflammation in the nasal lining, which increases sensitivity to inhalants.  Many people with allergic rhinitis are prone to allergic conjunctivitis (eye allergy). In addition, allergic rhinitis can make symptoms of asthma worse for people who suffer from both conditions.  Nonallergic Rhinitis At least one out of three people with rhinitis symptoms do not have allergies. Nonallergic rhinitis usually afflicts adults and causes year-round symptoms, especially runny nose and nasal congestion. This condition differs from allergic rhinitis because the immune system is not involved.

## 2023-09-04 LAB — GENECONNECT MOLECULAR SCREEN: Genetic Analysis Overall Interpretation: NEGATIVE

## 2023-10-19 DIAGNOSIS — J101 Influenza due to other identified influenza virus with other respiratory manifestations: Secondary | ICD-10-CM | POA: Diagnosis not present

## 2023-10-24 DIAGNOSIS — H5213 Myopia, bilateral: Secondary | ICD-10-CM | POA: Diagnosis not present

## 2023-10-26 DIAGNOSIS — J988 Other specified respiratory disorders: Secondary | ICD-10-CM | POA: Diagnosis not present

## 2023-12-07 ENCOUNTER — Ambulatory Visit: Payer: Medicare Other | Admitting: Allergy & Immunology

## 2023-12-07 DIAGNOSIS — D225 Melanocytic nevi of trunk: Secondary | ICD-10-CM | POA: Diagnosis not present

## 2023-12-07 DIAGNOSIS — Z85828 Personal history of other malignant neoplasm of skin: Secondary | ICD-10-CM | POA: Diagnosis not present

## 2023-12-07 DIAGNOSIS — L821 Other seborrheic keratosis: Secondary | ICD-10-CM | POA: Diagnosis not present

## 2023-12-07 DIAGNOSIS — L905 Scar conditions and fibrosis of skin: Secondary | ICD-10-CM | POA: Diagnosis not present

## 2023-12-25 DIAGNOSIS — D485 Neoplasm of uncertain behavior of skin: Secondary | ICD-10-CM | POA: Diagnosis not present

## 2023-12-25 DIAGNOSIS — Z85828 Personal history of other malignant neoplasm of skin: Secondary | ICD-10-CM | POA: Diagnosis not present

## 2024-01-29 DIAGNOSIS — L570 Actinic keratosis: Secondary | ICD-10-CM | POA: Diagnosis not present

## 2024-04-17 DIAGNOSIS — Z800 Family history of malignant neoplasm of digestive organs: Secondary | ICD-10-CM | POA: Diagnosis not present

## 2024-04-17 DIAGNOSIS — K219 Gastro-esophageal reflux disease without esophagitis: Secondary | ICD-10-CM | POA: Diagnosis not present

## 2024-04-17 DIAGNOSIS — K582 Mixed irritable bowel syndrome: Secondary | ICD-10-CM | POA: Diagnosis not present

## 2024-05-14 DIAGNOSIS — J029 Acute pharyngitis, unspecified: Secondary | ICD-10-CM | POA: Diagnosis not present

## 2024-05-27 DIAGNOSIS — E7800 Pure hypercholesterolemia, unspecified: Secondary | ICD-10-CM | POA: Diagnosis not present

## 2024-05-27 DIAGNOSIS — K219 Gastro-esophageal reflux disease without esophagitis: Secondary | ICD-10-CM | POA: Diagnosis not present

## 2024-05-27 DIAGNOSIS — Z125 Encounter for screening for malignant neoplasm of prostate: Secondary | ICD-10-CM | POA: Diagnosis not present

## 2024-05-27 DIAGNOSIS — R7301 Impaired fasting glucose: Secondary | ICD-10-CM | POA: Diagnosis not present

## 2024-05-28 ENCOUNTER — Other Ambulatory Visit: Payer: Self-pay | Admitting: Family Medicine

## 2024-05-28 DIAGNOSIS — E7800 Pure hypercholesterolemia, unspecified: Secondary | ICD-10-CM | POA: Diagnosis not present

## 2024-05-28 DIAGNOSIS — K219 Gastro-esophageal reflux disease without esophagitis: Secondary | ICD-10-CM | POA: Diagnosis not present

## 2024-05-28 DIAGNOSIS — I77819 Aortic ectasia, unspecified site: Secondary | ICD-10-CM

## 2024-05-28 DIAGNOSIS — R7301 Impaired fasting glucose: Secondary | ICD-10-CM | POA: Diagnosis not present

## 2024-05-28 DIAGNOSIS — Z0000 Encounter for general adult medical examination without abnormal findings: Secondary | ICD-10-CM | POA: Diagnosis not present

## 2024-05-28 DIAGNOSIS — R7303 Prediabetes: Secondary | ICD-10-CM | POA: Diagnosis not present

## 2024-05-28 DIAGNOSIS — Z23 Encounter for immunization: Secondary | ICD-10-CM | POA: Diagnosis not present

## 2024-06-05 ENCOUNTER — Other Ambulatory Visit

## 2024-06-05 ENCOUNTER — Ambulatory Visit
Admission: RE | Admit: 2024-06-05 | Discharge: 2024-06-05 | Disposition: A | Discharge status: Home / Self Care | Source: Ambulatory Visit | Attending: Family Medicine | Admitting: Family Medicine

## 2024-06-05 DIAGNOSIS — I77819 Aortic ectasia, unspecified site: Secondary | ICD-10-CM

## 2024-06-05 DIAGNOSIS — I712 Thoracic aortic aneurysm, without rupture, unspecified: Secondary | ICD-10-CM | POA: Diagnosis not present

## 2024-06-05 MED ORDER — IOPAMIDOL (ISOVUE-370) INJECTION 76%
75.0000 mL | Freq: Once | INTRAVENOUS | Status: AC | PRN
  Administered 2024-06-05: 75 mL via INTRAVENOUS

## 2024-06-13 DIAGNOSIS — M26629 Arthralgia of temporomandibular joint, unspecified side: Secondary | ICD-10-CM | POA: Diagnosis not present

## 2024-06-13 DIAGNOSIS — J329 Chronic sinusitis, unspecified: Secondary | ICD-10-CM | POA: Diagnosis not present

## 2024-06-26 DIAGNOSIS — R0982 Postnasal drip: Secondary | ICD-10-CM | POA: Diagnosis not present

## 2024-06-26 DIAGNOSIS — J321 Chronic frontal sinusitis: Secondary | ICD-10-CM | POA: Diagnosis not present

## 2024-07-08 DIAGNOSIS — L821 Other seborrheic keratosis: Secondary | ICD-10-CM | POA: Diagnosis not present

## 2024-07-08 DIAGNOSIS — L570 Actinic keratosis: Secondary | ICD-10-CM | POA: Diagnosis not present

## 2024-07-08 DIAGNOSIS — D225 Melanocytic nevi of trunk: Secondary | ICD-10-CM | POA: Diagnosis not present

## 2024-07-08 DIAGNOSIS — D2262 Melanocytic nevi of left upper limb, including shoulder: Secondary | ICD-10-CM | POA: Diagnosis not present

## 2024-07-08 DIAGNOSIS — Z85828 Personal history of other malignant neoplasm of skin: Secondary | ICD-10-CM | POA: Diagnosis not present

## 2024-07-25 DIAGNOSIS — J321 Chronic frontal sinusitis: Secondary | ICD-10-CM | POA: Diagnosis not present

## 2024-07-25 DIAGNOSIS — R0982 Postnasal drip: Secondary | ICD-10-CM | POA: Diagnosis not present
